# Patient Record
Sex: Female | Born: 1985 | Hispanic: Yes | Marital: Married | Smoking: Never smoker
Health system: Southern US, Community
[De-identification: ages and names within clinical notes are randomized; demographics above are authoritative.]

## PROBLEM LIST (undated history)

## (undated) DIAGNOSIS — J45909 Unspecified asthma, uncomplicated: Secondary | ICD-10-CM

## (undated) DIAGNOSIS — R112 Nausea with vomiting, unspecified: Secondary | ICD-10-CM

## (undated) DIAGNOSIS — K219 Gastro-esophageal reflux disease without esophagitis: Secondary | ICD-10-CM

## (undated) DIAGNOSIS — Z9889 Other specified postprocedural states: Secondary | ICD-10-CM

## (undated) HISTORY — PX: HYSTERECTOMY ABDOMINAL WITH SALPINGECTOMY: SHX6725

---

## 2019-08-17 ENCOUNTER — Emergency Department (HOSPITAL_COMMUNITY): Payer: Self-pay

## 2019-08-17 ENCOUNTER — Emergency Department (HOSPITAL_COMMUNITY)
Admission: EM | Admit: 2019-08-17 | Discharge: 2019-08-17 | Disposition: A | Discharge status: Home / Self Care | Payer: Self-pay | Attending: Emergency Medicine | Admitting: Emergency Medicine

## 2019-08-17 ENCOUNTER — Encounter (HOSPITAL_COMMUNITY): Payer: Self-pay | Admitting: *Deleted

## 2019-08-17 ENCOUNTER — Other Ambulatory Visit: Payer: Self-pay

## 2019-08-17 DIAGNOSIS — F17210 Nicotine dependence, cigarettes, uncomplicated: Secondary | ICD-10-CM | POA: Insufficient documentation

## 2019-08-17 DIAGNOSIS — R52 Pain, unspecified: Secondary | ICD-10-CM

## 2019-08-17 DIAGNOSIS — K8020 Calculus of gallbladder without cholecystitis without obstruction: Secondary | ICD-10-CM | POA: Insufficient documentation

## 2019-08-17 DIAGNOSIS — K829 Disease of gallbladder, unspecified: Secondary | ICD-10-CM

## 2019-08-17 HISTORY — DX: Unspecified asthma, uncomplicated: J45.909

## 2019-08-17 LAB — URINALYSIS, ROUTINE W REFLEX MICROSCOPIC
Bilirubin Urine: NEGATIVE
Glucose, UA: NEGATIVE mg/dL
Ketones, ur: NEGATIVE mg/dL
Leukocytes,Ua: NEGATIVE
Nitrite: NEGATIVE
Protein, ur: NEGATIVE mg/dL
Specific Gravity, Urine: 1.021 (ref 1.005–1.030)
pH: 5 (ref 5.0–8.0)

## 2019-08-17 LAB — COMPREHENSIVE METABOLIC PANEL
ALT: 114 U/L — ABNORMAL HIGH (ref 0–44)
AST: 171 U/L — ABNORMAL HIGH (ref 15–41)
Albumin: 3.7 g/dL (ref 3.5–5.0)
Alkaline Phosphatase: 126 U/L (ref 38–126)
Anion gap: 7 (ref 5–15)
BUN: 18 mg/dL (ref 6–20)
CO2: 26 mmol/L (ref 22–32)
Calcium: 9.2 mg/dL (ref 8.9–10.3)
Chloride: 104 mmol/L (ref 98–111)
Creatinine, Ser: 0.91 mg/dL (ref 0.44–1.00)
GFR calc Af Amer: >60 mL/min (ref >60)
GFR calc non Af Amer: >60 mL/min (ref >60)
Glucose, Bld: 142 mg/dL — ABNORMAL HIGH (ref 70–99)
Potassium: 4.4 mmol/L (ref 3.5–5.1)
Sodium: 137 mmol/L (ref 135–145)
Total Bilirubin: 0.5 mg/dL (ref 0.3–1.2)
Total Protein: 7.7 g/dL (ref 6.5–8.1)

## 2019-08-17 LAB — CBC
HCT: 39.7 % (ref 36.0–46.0)
Hemoglobin: 13.1 g/dL (ref 12.0–15.0)
MCH: 30 pg (ref 26.0–34.0)
MCHC: 33 g/dL (ref 30.0–36.0)
MCV: 91.1 fL (ref 80.0–100.0)
Platelets: 327 10*3/uL (ref 150–400)
RBC: 4.36 MIL/uL (ref 3.87–5.11)
RDW: 12.1 % (ref 11.5–15.5)
WBC: 12.1 10*3/uL — ABNORMAL HIGH (ref 4.0–10.5)
nRBC: 0 % (ref 0.0–0.2)

## 2019-08-17 LAB — D-DIMER, QUANTITATIVE: D-Dimer, Quant: 0.8 ug/mL-FEU — ABNORMAL HIGH (ref 0.00–0.50)

## 2019-08-17 LAB — TROPONIN I (HIGH SENSITIVITY): Troponin I (High Sensitivity): 6 ng/L (ref <18)

## 2019-08-17 LAB — LIPASE, BLOOD: Lipase: 39 U/L (ref 11–51)

## 2019-08-17 MED ORDER — HYDROMORPHONE HCL 1 MG/ML IJ SOLN
1.0000 mg | Freq: Once | INTRAMUSCULAR | Status: AC
  Administered 2019-08-17: 1 mg via INTRAVENOUS
  Filled 2019-08-17: qty 1

## 2019-08-17 MED ORDER — ONDANSETRON HCL 4 MG/2ML IJ SOLN
4.0000 mg | Freq: Once | INTRAMUSCULAR | Status: AC
  Administered 2019-08-17: 4 mg via INTRAVENOUS
  Filled 2019-08-17: qty 2

## 2019-08-17 MED ORDER — HYDROCODONE-ACETAMINOPHEN 5-325 MG PO TABS: 1.0000 | ORAL_TABLET | ORAL | 0 refills | Status: DC | PRN

## 2019-08-17 MED ORDER — IOHEXOL 350 MG/ML SOLN
75.0000 mL | Freq: Once | INTRAVENOUS | Status: AC | PRN
  Administered 2019-08-17: 75 mL via INTRAVENOUS

## 2019-08-17 MED ORDER — ONDANSETRON 4 MG PO TBDP: 4.0000 mg | ORAL_TABLET | Freq: Three times a day (TID) | ORAL | 0 refills | Status: DC | PRN

## 2019-08-17 NOTE — ED Provider Notes (Signed)
[facility] EMERGENCY DEPARTMENT Provider Note   CSN: [id number] Arrival date & time: 08/17/19  1541     History   Chief Complaint Chief Complaint  Patient presents with  . Abdominal Pain    HPI [name] Moore is a 33 y.o. female.     The history is provided by the patient. No language interpreter was used.  Abdominal Pain Pain location:  Epigastric Pain quality: aching, sharp and squeezing   Pain radiates to:  R flank and L flank Pain severity:  Moderate Onset quality:  Gradual Duration:  2 days Timing:  Constant Chronicity:  New Context: not alcohol use   Relieved by:  Nothing Worsened by:  Nothing Ineffective treatments:  None tried Risk factors: no alcohol abuse and has not had multiple surgeries   Pt began having pain in the epigastric area after eating a hamburger 3 days ago.  Pt reports nausea and vomiting.    Past Medical History:  Diagnosis Date  . Asthma     There are no active problems to display for this patient.   History reviewed. No pertinent surgical history.   OB History   No obstetric history on file.      Home Medications    Prior to Admission medications   Not on File    Family History No family history on file.  Social History Social History   Tobacco Use  . Smoking status: Current Every Day Smoker  . Smokeless tobacco: Never Used  Substance Use Topics  . Alcohol use: Never    Frequency: Never  . Drug use: Never     Allergies   Patient has no known allergies.   Review of Systems Review of Systems  Gastrointestinal: Positive for abdominal pain.  All other systems reviewed and are negative.    Physical Exam Updated Vital Signs BP (!) 95/54   Pulse 65   Temp 98.7 F (37.1 C) (Oral)   Resp 18   Ht 5\' 2"  (1.575 m)   Wt 93 kg   SpO2 97%   BMI 37.50 kg/m   Physical Exam Vitals signs and nursing note reviewed.  Constitutional:      General: She is not in acute distress.  Appearance: She is well-developed.  HENT:     Head: Normocephalic and atraumatic.  Eyes:     Conjunctiva/sclera: Conjunctivae normal.  Neck:     Musculoskeletal: Normal range of motion and neck supple.  Cardiovascular:     Rate and Rhythm: Normal rate and regular rhythm.     Heart sounds: No murmur.  Pulmonary:     Effort: Pulmonary effort is normal. No respiratory distress.     Breath sounds: Normal breath sounds.  Abdominal:     General: Bowel sounds are normal. There is no distension.     Palpations: Abdomen is soft.     Tenderness: There is abdominal tenderness.     Comments: Tender epigastric,  Pain with palpation,    Musculoskeletal: Normal range of motion.  Skin:    General: Skin is warm and dry.  Neurological:     Mental Status: She is alert and oriented to person, place, and time.  Psychiatric:        Mood and Affect: Mood normal.      ED Treatments / Results  Labs (all labs ordered are listed, but only abnormal results are displayed) Labs Reviewed  COMPREHENSIVE METABOLIC PANEL - Abnormal; Notable for the following components:      Result  Value   Glucose, Bld 142 (*)    AST 171 (*)    ALT 114 (*)    All other components within normal limits  CBC - Abnormal; Notable for the following components:   WBC 12.1 (*)    All other components within normal limits  URINALYSIS, ROUTINE W REFLEX MICROSCOPIC - Abnormal; Notable for the following components:   Hgb urine dipstick SMALL (*)    Bacteria, UA RARE (*)    All other components within normal limits  D-DIMER, QUANTITATIVE (NOT [facility]) - Abnormal; Notable for the following components:   D-Dimer, Quant 0.80 (*)    All other components within normal limits  LIPASE, BLOOD  TROPONIN I (HIGH SENSITIVITY)  TROPONIN I (HIGH SENSITIVITY)    EKG None  Radiology Dg Chest 2 View  Result Date: 08/17/2019 CLINICAL DATA:  Chest pain and shortness of breath. EXAM: CHEST - 2 VIEW COMPARISON:  None. FINDINGS: The lungs  are clear without focal pneumonia, edema, pneumothorax or pleural effusion. The cardiopericardial silhouette is within normal limits for size. The visualized bony structures of the thorax are intact. IMPRESSION: No active cardiopulmonary disease. Electronically Signed   By: Kennith CenterEric  Mansell M.D.   On: 08/17/2019 19:14   Ct Angio Chest Pe W And/or Wo Contrast  Result Date: 08/17/2019 CLINICAL DATA:  Epigastric pain with nausea and vomiting EXAM: CT ANGIOGRAPHY CHEST WITH CONTRAST TECHNIQUE: Multidetector CT imaging of the chest was performed using the standard protocol during bolus administration of intravenous contrast. Multiplanar CT image reconstructions and MIPs were obtained to evaluate the vascular anatomy. CONTRAST:  66 mL OMNIPAQUE 350 COMPARISON:  Chest x-ray and ultrasound from earlier in the same day. FINDINGS: Cardiovascular: Thoracic aorta and its branches are within normal limits. No aneurysmal dilatation or dissection is seen. No cardiac enlargement is seen. The pulmonary artery is well visualized within normal branching pattern. No definitive filling defect to suggest pulmonary embolism is noted. Mediastinum/Nodes: Thoracic inlet is within normal limits. No hilar or mediastinal adenopathy is seen. Esophagus as visualized is within normal limits. Lungs/Pleura: Lungs are well aerated bilaterally without focal infiltrate or sizable effusion. Upper Abdomen: Visualized upper abdomen demonstrates cholelithiasis similar to that seen on recent ultrasound. Musculoskeletal: No chest wall abnormality. No acute or significant osseous findings. Review of the MIP images confirms the above findings. IMPRESSION: No evidence of pulmonary emboli. Cholelithiasis without complicating factors. Electronically Signed   By: Alcide CleverMark  Lukens M.D.   On: 08/17/2019 20:46   [place] Abdomen Complete  Result Date: 08/17/2019 CLINICAL DATA:  Epigastric pain for several days EXAM: ABDOMEN ULTRASOUND COMPLETE COMPARISON:  None. FINDINGS:  Gallbladder: Well distended with multiple gallstones without complicating factors. No wall thickening or pericholecystic fluid is noted. Negative sonographic Murphy's sign is seen. Common bile duct: Diameter: 6.7 mm. No intrahepatic ductal dilatation is seen. Liver: Mild increased echogenicity is noted which may be related to fatty infiltration. Portal vein is patent on color Doppler imaging with normal direction of blood flow towards the liver. IVC: Not well visualized due to overlying bowel gas. Pancreas: Not well visualized due to overlying bowel gas. Spleen: Size and appearance within normal limits. Right Kidney: Length: 10.1 cm. Echogenicity within normal limits. No mass or hydronephrosis visualized. Left Kidney: Length: 11.4 cm. Echogenicity within normal limits. No mass or hydronephrosis visualized. Abdominal aorta: No aneurysm visualized. Other findings: None. IMPRESSION: Cholelithiasis without complicating factors. Mild prominence of the common bile duct is noted. Electronically Signed   By: Eulah PontMark  Lukens M.D.  On: 08/17/2019 19:04    Procedures Procedures (including critical care time)  Medications Ordered in ED Medications  HYDROmorphone (DILAUDID) injection 1 mg (1 mg Intravenous Given 08/17/19 1927)  ondansetron (ZOFRAN) injection 4 mg (4 mg Intravenous Given 08/17/19 1926)  iohexol (OMNIPAQUE) 350 MG/ML injection 75 mL (75 mLs Intravenous Contrast Given 08/17/19 2031)     Initial Impression / Assessment and Plan / ED Course  I have reviewed the triage vital signs and the nursing notes.  Pertinent labs & imaging results that were available during my care of the patient were reviewed by me and considered in my medical decision making (see chart for details).  Clinical Course as of Aug 16 2240  Sun Aug 17, 2019  2236 D-Dimer, Quant(!): 0.80 [AW]  2236 D-Dimer, Quant(!): 0.80 [AW]  2237 D-dimer, quantitative (not at [facility])(!) [AW]    Clinical Course User Index [AW] Burt Ek       MDM   Pt reports pain improved with pain medication.  Pt reports medicine made her to sleepy and se felt like it was to strong.  Pt reports some continued discomfort.  Pt has elevated wbc count of 12.1.  ast of 171 and alt of 114.  Pt has elevated ddimer of .80  Ct angio no pe.  Ultrasound shows choleolithiasis with mildly prominent common bile duct  I discussed the pt with Dr. Wilson Singer.   I consulted Dr. Grandville Silos who advised follow up in office.   Final Clinical Impressions(s) / ED Diagnoses   Final diagnoses:  Calculus of gallbladder without cholecystitis without obstruction    ED Discharge Orders         Ordered    ondansetron (ZOFRAN ODT) 4 MG disintegrating tablet  Every 8 hours PRN     08/17/19 2239    HYDROcodone-acetaminophen (NORCO/VICODIN) 5-325 MG tablet  Every 4 hours PRN     08/17/19 2239        An After Visit Summary was printed and given to the patient.    Sidney Ace 08/17/19 2241    Virgel Manifold, MD 08/19/19 [phone]

## 2019-08-17 NOTE — ED Notes (Signed)
To ultrasound

## 2019-08-17 NOTE — ED Triage Notes (Signed)
The pt arrived here from [place] 2 months ago  She is c/o epigastric pain with nausea and vomiting for 2-3 days this time  lmp  lmp none hysterectomy

## 2019-08-17 NOTE — ED Notes (Signed)
The pt was told in [place] that she has gallbladder problems

## 2019-08-21 ENCOUNTER — Telehealth: Payer: Self-pay

## 2019-08-21 NOTE — Telephone Encounter (Signed)
Message received from [facility] [place] requesting an appointment for patient at [facility]. Informed her that appt has been scheduled for 09/05/2019 @ 1430 with Dr Chapman Fitch

## 2019-09-03 ENCOUNTER — Telehealth: Payer: Self-pay | Admitting: Pediatric Intensive Care

## 2019-09-03 NOTE — Telephone Encounter (Signed)
Call from client. She has received the bill for the ED visit earlier in the month. CN advised client that she has an appointment at [facility] on Friday and gave reminder. Client states that she has transportation to appointment. CN advised client that she will communicate with clinic that client has received bill and will need to apply for financial assistance. CN also advised client to call tomorrow if transportation falls through. Client states that she will call. Lisette Abu Rn BSN CNP

## 2019-09-05 ENCOUNTER — Encounter: Payer: Self-pay | Admitting: Family Medicine

## 2019-09-05 ENCOUNTER — Other Ambulatory Visit: Payer: Self-pay

## 2019-09-05 ENCOUNTER — Ambulatory Visit: Payer: Self-pay | Attending: Family Medicine | Admitting: Family Medicine

## 2019-09-05 VITALS — BP 110/73 | HR 85 | Temp 98.0°F | Ht 60.0 in | Wt 215.0 lb

## 2019-09-05 DIAGNOSIS — Z758 Other problems related to medical facilities and other health care: Secondary | ICD-10-CM

## 2019-09-05 DIAGNOSIS — Z789 Other specified health status: Secondary | ICD-10-CM

## 2019-09-05 DIAGNOSIS — K8020 Calculus of gallbladder without cholecystitis without obstruction: Secondary | ICD-10-CM

## 2019-09-05 DIAGNOSIS — Z603 Acculturation difficulty: Secondary | ICD-10-CM

## 2019-09-05 DIAGNOSIS — Z09 Encounter for follow-up examination after completed treatment for conditions other than malignant neoplasm: Secondary | ICD-10-CM

## 2019-09-05 NOTE — Patient Instructions (Signed)
Colelitiasis Cholelithiasis  La colelitiasis tambin recibe el nombre de "clculos biliares". Es un tipo de enfermedad de la vescula biliar. La vescula biliar es un rgano que almacena un lquido (bilis) que ayuda a Publishing copy las [place]. Los clculos biliares podran no causar sntomas (clculos biliares silenciosos) hasta provocar una obstruccin; luego, pueden causar dolor (ataque de la vescula biliar). Siga estas indicaciones en su casa:  Tome los medicamentos de venta libre y los recetados solamente como se lo haya indicado el mdico.  Mantenga un peso saludable.  Consuma alimentos saludables. Esto incluye lo siguiente: ? Comer una menor cantidad de alimentos grasos, como los alimentos fritos. ? Comer una menor cantidad de carbohidratos refinados. Los carbohidratos refinados son los panes y los cereales muy procesados, como el pan blanco y el arroz blanco. En cambio, elegir cereales integrales, como el pan integral o el arroz integral. ? Consumir ms fibra. [place], las frutas frescas y los frijoles son fuentes saludables de [place].  Concurra a todas las visitas de seguimiento como se lo haya indicado el mdico. Esto es importante. Comunquese con un mdico si:  De repente, siente dolor en el costado superior derecho del vientre (abdomen). El dolor podra extenderse hasta el hombro derecho o el pecho. Estos pueden ser sntomas de un ataque de la vescula biliar.  Siente Higher education careers adviser (tiene nuseas).  Devuelve (vomita).  Le han diagnosticado clculos biliares que no presentan sntomas y tiene lo siguiente: ? Dolor abdominal. ? Molestias, ardor o sensacin de plenitud en la parte superior del vientre (empacho). Solicite ayuda de inmediato si:  De repente, siente dolor en el costado superior derecho del vientre que dura ms de 2horas.  Tiene dolor abdominal que dura ms de 5horas.  Tiene fiebre o siente escalofros.  Sigue sintiendo nuseas o vomitando.  Nota que  la piel o la parte blanca del ojo estn amarillas (ictericia).  Hace pis (orina) de color oscuro.  Su materia fecal (heces) es de tono muy claro. Resumen  La colelitiasis tambin recibe el nombre de "clculos biliares".  La vescula biliar es un rgano que almacena un lquido (bilis) que ayuda a Publishing copy las [place].  Los clculos biliares silenciosos son clculos biliares que no causan sntomas.  Un ataque de la vescula biliar podra causar un dolor repentino en el costado superior derecho del vientre. El dolor podra extenderse hasta el hombro derecho o el pecho. Si esto ocurre, comunquese con el mdico.  Si le aparece un dolor repentino en el costado superior derecho del vientre que dura ms de 2horas, busque ayuda de inmediato. Esta informacin no tiene Marine scientist el consejo del mdico. Asegrese de hacerle al mdico cualquier pregunta que tenga. Document Released: 02/16/2009 Document Revised: 05/21/2017 Document Reviewed: 05/14/2013 Elsevier Patient Education  2020 [employer].

## 2019-09-05 NOTE — Progress Notes (Addendum)
Subjective:  Patient ID: [name] Moore, female    DOB: Nov 01, 1986  Age: 33 y.o. MRN: [id number]  CC: Hospitalization Follow-up  Due to a language barrier, Stratus video interpretation system was used at today's visit  HPI [name] Moore, 33 year old female, who presents to establish care after ED visit on 08/17/2019 at which time she presented secondary to abdominal pain.  Patient had ultrasound showing cholelithiasis with mildly prominent common bile duct and patient had white blood cell count of 12.1, AST of 171 and ALT of 114.  Patient was discharged with prescriptions for Zofran and hydrocodone-acetaminophen and needs outpatient follow-up.  She was to follow-up with Dr. Violeta Gelinas in general surgery but when she called the office, she states that she could not afford follow-up due to lack of insurance.  She states that someone from the office, [place], told her that she would help patient with resources in order to be able to find someone who could do patient surgery.  Patient has paperwork with her at today's visit so that she can apply for the orange card to help with the cost of her surgery.      She reports no current abdominal pain or nausea at today's visit.  She did have some mild right upper to mid abdominal discomfort a few days ago.  She is aware that she should avoid spicy and greasy foods.  She denies any current issues with diarrhea/loose stools.  No fever or chills.  She has had no headaches or dizziness, no chest pain or palpitations and no shortness of breath or cough.  She reports that her only past medical history is that she had issues with asthma in childhood but these have now resolved and she has had no problems with her asthma as an adult.  She has past surgical history of hysterectomy due to an ectopic surgery.  She states that she was having surgery she thought to have removal of a fallopian tube but after the surgery she was told that the uterus had to be  removed.  Past Medical History:  Diagnosis Date  . Asthma   She reports asthma in childhood but no issues as an adult  Past Surgical History:  Procedure Laterality Date  . HYSTERECTOMY ABDOMINAL WITH SALPINGECTOMY     due to ectopic surgery per patient    Family History  Problem Relation Age of Onset  . Hypertension Father   . Hypertension Maternal Grandmother   . Hypertension Maternal Grandfather     Social History   Tobacco Use  . Smoking status: Former Games developer  . Smokeless tobacco: Never Used  Substance Use Topics  . Alcohol use: Never    Frequency: Never    ROS Review of Systems  Constitutional: Positive for fatigue. Negative for chills and fever.  HENT: Negative for sore throat and trouble swallowing.   Respiratory: Negative for cough and shortness of breath.   Cardiovascular: Negative for chest pain and palpitations.  Gastrointestinal: Positive for abdominal pain (Earlier this week but not currently) and nausea (Earlier this week). Negative for constipation, diarrhea and vomiting.  Endocrine: Negative for cold intolerance, heat intolerance, polydipsia, polyphagia and polyuria.  Genitourinary: Negative for dysuria and frequency.  Neurological: Negative for dizziness and headaches.    Objective:   Today's Vitals: BP 110/73   Pulse 85   Temp 98 F (36.7 C) (Oral)   Ht 5' (1.524 m)   Wt 215 lb (97.5 kg)   SpO2 97%   BMI 41.99 kg/m  Physical Exam Constitutional:      General: She is not in acute distress.    Appearance: Normal appearance. She is obese. She is not ill-appearing.  Neck:     Musculoskeletal: Normal range of motion and neck supple. No muscular tenderness.  Cardiovascular:     Rate and Rhythm: Normal rate and regular rhythm.  Pulmonary:     Effort: Pulmonary effort is normal.     Breath sounds: Normal breath sounds.  Abdominal:     Palpations: Abdomen is soft.     Tenderness: There is abdominal tenderness (Mild right upper quadrant  tenderness on exam). There is no right CVA tenderness, left CVA tenderness, guarding or rebound.  Musculoskeletal:        General: No tenderness.     Right lower leg: No edema.     Left lower leg: No edema.     Comments: No CVA tenderness, no reproducible back pain  Lymphadenopathy:     Cervical: No cervical adenopathy.  Skin:    General: Skin is warm and dry.     Coloration: Skin is not jaundiced.  Neurological:     General: No focal deficit present.     Mental Status: She is alert and oriented to person, place, and time.  Psychiatric:        Mood and Affect: Mood normal.        Behavior: Behavior normal.     Assessment & Plan:  1. Gallstones; 2.  Encounter for examination following treatment at hospital Patient status post emergency department visit on 08/17/2019 due to abdominal pain and was diagnosed with gallstones.  She had ultrasound showing cholelithiasis without complicating factors.  Mild prominence of the common bile duct at 6.7 mm.  Referral will be placed for patient to be seen by general surgery for removal of the gallbladder.  Handout provided in Spanish on gallstones and she is aware that she should avoid eating spicy/greasy foods.  Patient will need appointment with financial counselors in order to see if she qualifies for financial assistance program through this office and patient is aware. - Ambulatory referral to General Surgery  Outpatient Encounter Medications as of 09/05/2019  Medication Sig  . HYDROcodone-acetaminophen (NORCO/VICODIN) 5-325 MG tablet Take 1 tablet by mouth every 4 (four) hours as needed.  . ondansetron (ZOFRAN ODT) 4 MG disintegrating tablet Take 1 tablet (4 mg total) by mouth every 8 (eight) hours as needed for nausea or vomiting.   No facility-administered encounter medications on file as of 09/05/2019.    *Patient was offered and agreed to have influenza immunization.  This will be given as a nurse visit while she is here today at the office.    Follow-up: Return for As needed; follow-up after surgery and schedule annual well exam.   Antony Blackbird MD

## 2019-09-08 ENCOUNTER — Other Ambulatory Visit: Payer: Self-pay

## 2019-09-08 ENCOUNTER — Ambulatory Visit: Payer: Self-pay | Attending: Family Medicine

## 2019-10-16 ENCOUNTER — Ambulatory Visit (INDEPENDENT_AMBULATORY_CARE_PROVIDER_SITE_OTHER): Payer: Self-pay | Admitting: General Surgery

## 2019-10-16 ENCOUNTER — Encounter: Payer: Self-pay | Admitting: General Surgery

## 2019-10-16 ENCOUNTER — Other Ambulatory Visit: Payer: Self-pay

## 2019-10-16 VITALS — BP 136/83 | HR 78 | Temp 98.1°F | Resp 14 | Ht 62.0 in | Wt 215.0 lb

## 2019-10-16 DIAGNOSIS — K8020 Calculus of gallbladder without cholecystitis without obstruction: Secondary | ICD-10-CM

## 2019-10-16 NOTE — H&P (View-Only) (Signed)
Patient ID: [name] Moore, female   DOB: 03-08-1986, 33 y.o.   MRN: [id number]  Chief Complaint  Patient presents with  . New Patient (Initial Visit)    gallbladder    HPI [name] Moore is a 33 y.o. female.   She has been referred by her primary care provider, Dr. Antony Blackbird, for surgical evaluation of cholelithiasis.  Today's visit was held with the assistance of a Spanish language interpreter.    [name] presented to the emergency department at [facility] on September 13.  She had abdominal pain, nausea, vomiting and was found to have cholelithiasis on right upper quadrant ultrasound.  She was referred to see a surgeon in [place], but secondary to her lack of insurance, she was unable to keep that appointment.  She has been working with a foundation to obtain coverage for surgery.  She states that since her visit to the emergency department, she has had intermittent episodes of right upper quadrant pain.  These have been accompanied by nausea and occasionally vomiting.  The pain is predominantly in her right upper quadrant but does radiate around her side and also from her epigastric area through to her back.  She has never had jaundice or pancreatitis.  She does report recent dark-colored urine with a strong odor as well as some minor burning with urination.  She does not know if this could be a urinary tract infection.  She has not had any diarrhea or constipation.  No fevers or chills.  Her symptoms do seem to be exacerbated by eating spicy or fatty foods.   Past Medical History:  Diagnosis Date  . Asthma     Past Surgical History:  Procedure Laterality Date  . HYSTERECTOMY ABDOMINAL WITH SALPINGECTOMY     due to ectopic pregnancy per patient    Family History  Problem Relation Age of Onset  . Hypertension Father   . Hypertension Maternal Grandmother   . Hypertension Maternal Grandfather     Social History Social History   Tobacco Use  .  Smoking status: Never Smoker  . Smokeless tobacco: Never Used  Substance Use Topics  . Alcohol use: Never    Frequency: Never  . Drug use: Never    No Known Allergies  Current Outpatient Medications  Medication Sig Dispense Refill  . HYDROcodone-acetaminophen (NORCO/VICODIN) 5-325 MG tablet Take 1 tablet by mouth every 4 (four) hours as needed. 10 tablet 0   No current facility-administered medications for this visit.     Review of Systems Review of Systems  All other systems reviewed and are negative. Or as discussed in the history of present illness  Blood pressure 136/83, pulse 78, temperature 98.1 F (36.7 C), temperature source Temporal, resp. rate 14, height 5\' 2"  (1.575 m), weight 215 lb (97.5 kg), SpO2 98 %. Body mass index is 39.32 kg/m.  Physical Exam Physical Exam Constitutional:      General: She is not in acute distress.    Appearance: She is obese.  HENT:     Head: Normocephalic and atraumatic.     Nose:     Comments: Covered with a mask secondary to COVID-19 precautions    Mouth/Throat:     Comments: Covered with a mask secondary to COVID-19 precautions Eyes:     General: No scleral icterus.       Right eye: No discharge.        Left eye: No discharge.  Neck:     Musculoskeletal: No neck rigidity.  Comments: No thyromegaly or dominant thyroid masses appreciated Cardiovascular:     Rate and Rhythm: Normal rate and regular rhythm.     Pulses: Normal pulses.  Pulmonary:     Effort: Pulmonary effort is normal.     Breath sounds: Normal breath sounds.  Abdominal:     General: Bowel sounds are normal.     Palpations: Abdomen is soft.     Tenderness: There is abdominal tenderness. There is no guarding or rebound.     Comments: There is mild tenderness to deep palpation in the right upper quadrant.  Murphy sign is negative.  Genitourinary:    Comments: Deferred Musculoskeletal:        General: No swelling.     Right lower leg: No edema.     Left  lower leg: No edema.  Lymphadenopathy:     Cervical: No cervical adenopathy.  Skin:    General: Skin is warm and dry.  Neurological:     General: No focal deficit present.     Mental Status: She is alert and oriented to person, place, and time.  Psychiatric:        Behavior: Behavior normal.     Data Reviewed Labs obtained at the time of her emergency department visit show a normal bilirubin with mild elevation in transaminases.  Lipase was within normal limits.  She did have a mild leukocytosis at 12.1.  Dimer was elevated but a PE protocol CT scan was negative.  Results for [name] Moore (MRN [id number]) as of 10/16/2019 11:48  Ref. Range 08/17/2019 17:05  Sodium Latest Ref Range: 135 - 145 mmol/L 137  Potassium Latest Ref Range: 3.5 - 5.1 mmol/L 4.4  Chloride Latest Ref Range: 98 - 111 mmol/L 104  CO2 Latest Ref Range: 22 - 32 mmol/L 26  Glucose Latest Ref Range: 70 - 99 mg/dL 142 (H)  BUN Latest Ref Range: 6 - 20 mg/dL 18  Creatinine Latest Ref Range: 0.44 - 1.00 mg/dL 0.91  Calcium Latest Ref Range: 8.9 - 10.3 mg/dL 9.2  Anion gap Latest Ref Range: 5 - 15  7  Alkaline Phosphatase Latest Ref Range: 38 - 126 U/L 126  Albumin Latest Ref Range: 3.5 - 5.0 g/dL 3.7  Lipase Latest Ref Range: 11 - 51 U/L 39  AST Latest Ref Range: 15 - 41 U/L 171 (H)  ALT Latest Ref Range: 0 - 44 U/L 114 (H)  Total Protein Latest Ref Range: 6.5 - 8.1 g/dL 7.7  Total Bilirubin Latest Ref Range: 0.3 - 1.2 mg/dL 0.5  GFR, Est Non African American Latest Ref Range: >60 mL/min >60  GFR, Est African American Latest Ref Range: >60 mL/min >60  Troponin I (High Sensitivity) Latest Ref Range: <18 ng/L 6  WBC Latest Ref Range: 4.0 - 10.5 K/uL 12.1 (H)  RBC Latest Ref Range: 3.87 - 5.11 MIL/uL 4.36  Hemoglobin Latest Ref Range: 12.0 - 15.0 g/dL 13.1  HCT Latest Ref Range: 36.0 - 46.0 % 39.7  MCV Latest Ref Range: 80.0 - 100.0 fL 91.1  MCH Latest Ref Range: 26.0 - 34.0 pg 30.0  MCHC Latest Ref Range:  30.0 - 36.0 g/dL 33.0  RDW Latest Ref Range: 11.5 - 15.5 % 12.1  Platelets Latest Ref Range: 150 - 400 K/uL 327  nRBC Latest Ref Range: 0.0 - 0.2 % 0.0  D-Dimer, Quant Latest Ref Range: 0.00 - 0.50 ug/mL-FEU 0.80 (H)   I reviewed the right upper quadrant ultrasound that was performed on 13 September.    I am in agreement with the radiologist report which is copied here: CLINICAL DATA:  Epigastric pain for several days  EXAM: ABDOMEN ULTRASOUND COMPLETE  COMPARISON:  None.  FINDINGS: Gallbladder: Well distended with multiple gallstones without complicating factors. No wall thickening or pericholecystic fluid is noted. Negative sonographic Murphy's sign is seen.  Common bile duct: Diameter: 6.7 mm. No intrahepatic ductal dilatation is seen.  Liver: Mild increased echogenicity is noted which may be related to fatty infiltration. Portal vein is patent on color Doppler imaging with normal direction of blood flow towards the liver.  IVC: Not well visualized due to overlying bowel gas.  Pancreas: Not well visualized due to overlying bowel gas.  Spleen: Size and appearance within normal limits.  Right Kidney: Length: 10.1 cm. Echogenicity within normal limits. No mass or hydronephrosis visualized.  Left Kidney: Length: 11.4 cm. Echogenicity within normal limits. No mass or hydronephrosis visualized.  Abdominal aorta: No aneurysm visualized.  Other findings: None.  IMPRESSION: Cholelithiasis without complicating factors. Mild prominence of the common bile duct is noted.  Assessment This is a 33 year old woman with symptomatic cholelithiasis.  She has had multiple attacks with increasing frequency.  I have recommended that she undergo laparoscopic cholecystectomy.   Plan I discussed the procedure in detail.  We discussed the risks and benefits of a laparoscopic cholecystectomy and possible cholangiogram including, but not limited to: bleeding, infection, injury to  surrounding structures such as the intestine or liver, bile leak, retained gallstones, need to convert to an open procedure, prolonged diarrhea, blood clots such as DVT, common bile duct injury, anesthesia risks, and possible need for additional procedures. The patient had the opportunity to ask any questions and these were answered to her satisfaction.    We will work on getting her scheduled.  We will also need to confirm that she has obtained the orange card for financial assistance.  Duanne Guess 10/16/2019, 11:43 AM

## 2019-10-16 NOTE — Progress Notes (Signed)
Patient ID: [name] Moore Moore, female   DOB: 03-08-1986, 33 y.o.   MRN: [id number]  Chief Complaint  Patient presents with  . New Patient (Initial Visit)    gallbladder    HPI [name] Moore Moore is a 33 y.o. female.   She has been referred by her primary care provider, Dr. Antony Moore, for surgical evaluation of cholelithiasis.  Today's visit was held with the assistance of a Spanish language interpreter.    [name] Moore presented to the emergency department at [facility] on September 13.  She had abdominal pain, nausea, vomiting and was found to have cholelithiasis on right upper quadrant ultrasound.  She was referred to see a surgeon in [place], but secondary to her lack of insurance, she was unable to keep that appointment.  She has been working with a foundation to obtain coverage for surgery.  She states that since her visit to the emergency department, she has had intermittent episodes of right upper quadrant pain.  These have been accompanied by nausea and occasionally vomiting.  The pain is predominantly in her right upper quadrant but does radiate around her side and also from her epigastric area through to her back.  She has never had jaundice or pancreatitis.  She does report recent dark-colored urine with a strong odor as well as some minor burning with urination.  She does not know if this could be a urinary tract infection.  She has not had any diarrhea or constipation.  No fevers or chills.  Her symptoms do seem to be exacerbated by eating spicy or fatty foods.   Past Medical History:  Diagnosis Date  . Asthma     Past Surgical History:  Procedure Laterality Date  . HYSTERECTOMY ABDOMINAL WITH SALPINGECTOMY     due to ectopic pregnancy per patient    Family History  Problem Relation Age of Onset  . Hypertension Father   . Hypertension Maternal Grandmother   . Hypertension Maternal Grandfather     Social History Social History   Tobacco Use  .  Smoking status: Never Smoker  . Smokeless tobacco: Never Used  Substance Use Topics  . Alcohol use: Never    Frequency: Never  . Drug use: Never    No Known Allergies  Current Outpatient Medications  Medication Sig Dispense Refill  . HYDROcodone-acetaminophen (NORCO/VICODIN) 5-325 MG tablet Take 1 tablet by mouth every 4 (four) hours as needed. 10 tablet 0   No current facility-administered medications for this visit.     Review of Systems Review of Systems  All other systems reviewed and are negative. Or as discussed in the history of present illness  Blood pressure 136/83, pulse 78, temperature 98.1 F (36.7 C), temperature source Temporal, resp. rate 14, height 5\' 2"  (1.575 m), weight 215 lb (97.5 kg), SpO2 98 %. Body mass index is 39.32 kg/m.  Physical Exam Physical Exam Constitutional:      General: She is not in acute distress.    Appearance: She is obese.  HENT:     Head: Normocephalic and atraumatic.     Nose:     Comments: Covered with a mask secondary to COVID-19 precautions    Mouth/Throat:     Comments: Covered with a mask secondary to COVID-19 precautions Eyes:     General: No scleral icterus.       Right eye: No discharge.        Left eye: No discharge.  Neck:     Musculoskeletal: No neck rigidity.  Comments: No thyromegaly or dominant thyroid masses appreciated Cardiovascular:     Rate and Rhythm: Normal rate and regular rhythm.     Pulses: Normal pulses.  Pulmonary:     Effort: Pulmonary effort is normal.     Breath sounds: Normal breath sounds.  Abdominal:     General: Bowel sounds are normal.     Palpations: Abdomen is soft.     Tenderness: There is abdominal tenderness. There is no guarding or rebound.     Comments: There is mild tenderness to deep palpation in the right upper quadrant.  Murphy sign is negative.  Genitourinary:    Comments: Deferred Musculoskeletal:        General: No swelling.     Right lower leg: No edema.     Left  lower leg: No edema.  Lymphadenopathy:     Cervical: No cervical adenopathy.  Skin:    General: Skin is warm and dry.  Neurological:     General: No focal deficit present.     Mental Status: She is alert and oriented to person, place, and time.  Psychiatric:        Behavior: Behavior normal.     Data Reviewed Labs obtained at the time of her emergency department visit show a normal bilirubin with mild elevation in transaminases.  Lipase was within normal limits.  She did have a mild leukocytosis at 12.1.  Dimer was elevated but a PE protocol CT scan was negative.  Results for [name] Moore Moore, [name] Moore (MRN [id number]) as of 10/16/2019 11:48  Ref. Range 08/17/2019 17:05  Sodium Latest Ref Range: 135 - 145 mmol/L 137  Potassium Latest Ref Range: 3.5 - 5.1 mmol/L 4.4  Chloride Latest Ref Range: 98 - 111 mmol/L 104  CO2 Latest Ref Range: 22 - 32 mmol/L 26  Glucose Latest Ref Range: 70 - 99 mg/dL 782142 (H)  BUN Latest Ref Range: 6 - 20 mg/dL 18  Creatinine Latest Ref Range: 0.44 - 1.00 mg/dL 9.560.91  Calcium Latest Ref Range: 8.9 - 10.3 mg/dL 9.2  Anion gap Latest Ref Range: 5 - 15  7  Alkaline Phosphatase Latest Ref Range: 38 - 126 U/L 126  Albumin Latest Ref Range: 3.5 - 5.0 g/dL 3.7  Lipase Latest Ref Range: 11 - 51 U/L 39  AST Latest Ref Range: 15 - 41 U/L 171 (H)  ALT Latest Ref Range: 0 - 44 U/L 114 (H)  Total Protein Latest Ref Range: 6.5 - 8.1 g/dL 7.7  Total Bilirubin Latest Ref Range: 0.3 - 1.2 mg/dL 0.5  GFR, Est Non African American Latest Ref Range: >60 mL/min >60  GFR, Est African American Latest Ref Range: >60 mL/min >60  Troponin I (High Sensitivity) Latest Ref Range: <18 ng/L 6  WBC Latest Ref Range: 4.0 - 10.5 K/uL 12.1 (H)  RBC Latest Ref Range: 3.87 - 5.11 MIL/uL 4.36  Hemoglobin Latest Ref Range: 12.0 - 15.0 g/dL 21.313.1  HCT Latest Ref Range: 36.0 - 46.0 % 39.7  MCV Latest Ref Range: 80.0 - 100.0 fL 91.1  MCH Latest Ref Range: 26.0 - 34.0 pg 30.0  MCHC Latest Ref Range:  30.0 - 36.0 g/dL 08.633.0  RDW Latest Ref Range: 11.5 - 15.5 % 12.1  Platelets Latest Ref Range: 150 - 400 K/uL 327  nRBC Latest Ref Range: 0.0 - 0.2 % 0.0  D-Dimer, Quant Latest Ref Range: 0.00 - 0.50 ug/mL-FEU 0.80 (H)   I reviewed the right upper quadrant ultrasound that was performed on 13 September.  I am in agreement with the radiologist report which is copied here: CLINICAL DATA:  Epigastric pain for several days  EXAM: ABDOMEN ULTRASOUND COMPLETE  COMPARISON:  None.  FINDINGS: Gallbladder: Well distended with multiple gallstones without complicating factors. No wall thickening or pericholecystic fluid is noted. Negative sonographic Murphy's sign is seen.  Common bile duct: Diameter: 6.7 mm. No intrahepatic ductal dilatation is seen.  Liver: Mild increased echogenicity is noted which may be related to fatty infiltration. Portal vein is patent on color Doppler imaging with normal direction of blood flow towards the liver.  IVC: Not well visualized due to overlying bowel gas.  Pancreas: Not well visualized due to overlying bowel gas.  Spleen: Size and appearance within normal limits.  Right Kidney: Length: 10.1 cm. Echogenicity within normal limits. No mass or hydronephrosis visualized.  Left Kidney: Length: 11.4 cm. Echogenicity within normal limits. No mass or hydronephrosis visualized.  Abdominal aorta: No aneurysm visualized.  Other findings: None.  IMPRESSION: Cholelithiasis without complicating factors. Mild prominence of the common bile duct is noted.  Assessment This is a 33 year old woman with symptomatic cholelithiasis.  She has had multiple attacks with increasing frequency.  I have recommended that she undergo laparoscopic cholecystectomy.   Plan I discussed the procedure in detail.  We discussed the risks and benefits of a laparoscopic cholecystectomy and possible cholangiogram including, but not limited to: bleeding, infection, injury to  surrounding structures such as the intestine or liver, bile leak, retained gallstones, need to convert to an open procedure, prolonged diarrhea, blood clots such as DVT, common bile duct injury, anesthesia risks, and possible need for additional procedures. The patient had the opportunity to ask any questions and these were answered to her satisfaction.    We will work on getting her scheduled.  We will also need to confirm that she has obtained the orange card for financial assistance.  Duanne Guess 10/16/2019, 11:43 AM

## 2019-10-16 NOTE — Patient Instructions (Addendum)
Debe informarnos cuando reciba asistencia financiera.  Necesitaremos este papeleo.    lo llamaremos para programar su ciruga  Colecistectoma laparoscpica Laparoscopic Cholecystectomy Una colecistectoma laparoscpica es un procedimiento que se realiza para extirpar la vescula biliar. La vescula biliar es un rgano que tiene forma de pera y se encuentra debajo del hgado, del lado derecho del cuerpo. La vescula biliar almacena bilis, un lquido que ayuda a [employer]. La colecistectoma se realiza con frecuencia debido a la inflamacin de la vescula biliar (colecistitis). Esta afeccin normalmente se debe a la acumulacin de clculos biliares (colelitiasis) en la vescula biliar. Estos clculos pueden obstruir el flujo de la bilis, lo que produce inflamacin y Engineer, mining. En los [employer], podr ser [place]. Este procedimiento se realiza a travs de incisiones pequeas en el abdomen (ciruga laparoscpica). Se introduce un endoscopio delgado que tiene Secretary/administrator (laparoscopio) a travs de una incisin. A travs de las otras incisiones, se introducen pequeos instrumentos quirrgicos. En algunos casos, un procedimiento de [place] laparoscpica puede convertirse en un tipo de ciruga que se realiza a travs de una incisin ms grande [place]). Informe al mdico acerca de lo siguiente:  Cualquier alergia que tenga.  Todos los [employer], incluidos vitaminas, hierbas, gotas oftlmicas, cremas y [address] de [address].  Cualquier problema que usted o sus familiares hayan tenido con anestsicos.  Cualquier enfermedad de la sangre que tenga.  Cirugas a las que se someti.  Cualquier afeccin mdica que tenga.  Si est embarazada o podra estarlo. Cules son los riesgos? En general, se trata de un procedimiento seguro. Sin embargo, pueden ocurrir complicaciones, por ejemplo:  Infeccin.  Hemorragia.  Reacciones alrgicas a  los medicamentos.  Daos a Systems developer u otros rganos.  Un clculo que queda en el conducto biliar comn (coldoco). El conducto coldoco transporta la bilis desde la vescula biliar hacia el intestino delgado.  Una filtracin de bilis del conducto cstico que se comprime cuando se extirpa la vescula biliar. Qu ocurre antes del procedimiento? Mantenerse hidratado Siga las indicaciones del mdico acerca de mantenerse hidratado, las cuales pueden incluir lo siguiente:  Hasta 2horas antes del procedimiento, puede beber lquidos transparentes, como agua, jugos frutales transparentes, caf negro y t solo. Restricciones en las comidas y [address] Siga las indicaciones del mdico respecto de las comidas y bebidas, las cuales pueden incluir lo siguiente:  Ocho horas antes del procedimiento, deje de ingerir comidas o alimentos pesados, por ejemplo, carne, alimentos fritos o alimentos grasos.  Seis horas antes del procedimiento, deje de ingerir comidas o alimentos livianos, como tostadas o cereales.  Seis horas antes del procedimiento, deje de beber [place] o bebidas que [employer].  Dos horas antes del procedimiento, deje de beber lquidos transparentes. Medicamentos  Consulte al mdico sobre: ? Multimedia programmer o suspender los medicamentos que toma habitualmente. Esto es muy importante si toma medicamentos para la diabetes o anticoagulantes. ? Tomar medicamentos como aspirina e ibuprofeno. Estos medicamentos pueden tener un efecto anticoagulante en la [place]. No tome estos medicamentos antes del procedimiento si su mdico le indica que no lo haga.  Pueden indicarle un antibitico para ayudar a prevenir infecciones. Instrucciones generales  Infrmele al mdico antes de la ciruga si se ha resfriado o si tiene una infeccin.  Haga que alguien lo lleve a su casa desde el hospital o la clnica.  Pregntele al mdico cmo se Cabin crew o se Audiological scientist de la Leisure centre manager. Qu ocurre  durante el procedimiento?  Para disminuir el riesgo de contraer una infeccin: ? El equipo mdico se lavar o se Transport planner. ? Le lavarn la piel con jabn. ? Pueden rasurarle la zona [place].  Pueden colocarle un tubo (catter) intravenoso en una de las venas.  Le administrarn uno o ms de los siguientes medicamentos: ? Un medicamento para ayudarlo a relajarse (sedante). ? Un medicamento que lo har dormir (anestesia general).  Le colocarn un tubo en la boca para que pueda respirar.  Su cirujano le har varios cortes pequeos (incisiones) en el abdomen.  El laparoscopio se introducir a travs de una de las pequeas incisiones. La cmara del laparoscopio enviar imgenes a una pantalla de televisin (monitor) que se encuentra en el quirfano. Esto permitir a su Adult nurse del abdomen.  Le inyectarn un gas similar al aire en el abdomen. Esto expandir el abdomen para que el cirujano tenga ms lugar para Chief of Staff.  El resto del instrumental necesario para el procedimiento se introducir a travs de las otras incisiones. Se extirpar la vescula biliar a travs de una de las incisiones.  Se puede examinar el conducto coldoco. Si se encuentran clculos en el conducto coldoco, tal vez deban extirparse.  Despus de la extirpacin de la vescula biliar, se cerrarn las incisiones con puntos (suturas), grapas o goma para cerrar la piel.  Las incisiones pueden cubrirse con una venda (vendaje). Este procedimiento puede variar segn el mdico y el hospital. Sander Nephew ocurre despus del procedimiento?  Le controlarn la presin arterial, la frecuencia cardaca, la frecuencia respiratoria y Retail buyer de oxgeno en la sangre hasta que desaparezca el efecto de los medicamentos administrados.  Le darn analgsicos para Financial controller, si es necesario.  No conduzca durante 24horas si le administraron un sedante. Esta informacin no tiene Marine scientist  el consejo del mdico. Asegrese de hacerle al mdico cualquier pregunta que tenga. Document Released: 11/20/2005 Document Revised: 03/02/2018 Document Reviewed: 05/08/2016 Elsevier Patient Education  2020 [employer].

## 2019-10-23 ENCOUNTER — Telehealth: Payer: Self-pay

## 2019-10-23 NOTE — Telephone Encounter (Signed)
Patient would like to schedule surgery. She has received her financial assistance. Please advise.

## 2019-10-27 ENCOUNTER — Other Ambulatory Visit: Payer: Self-pay | Admitting: General Surgery

## 2019-10-27 DIAGNOSIS — K8020 Calculus of gallbladder without cholecystitis without obstruction: Secondary | ICD-10-CM

## 2019-10-28 ENCOUNTER — Telehealth: Payer: Self-pay | Admitting: General Surgery

## 2019-10-28 NOTE — Telephone Encounter (Signed)
I have called patient to go over surgery information below. No answer. I have left a message for patient to call the office back.  Surgery Date: 11/05/19 with Dr Wilburn Cornelia cholecystectomy.  Preadmission Testing Date: 11/03/19 @ 11:45-office appointment-patient needs to arrive at the [facility], Covid Testing Date: This will be performed after the patient's preadmission testing appointment - patient advised to go to the [facility]  [employer] Video sent via [employer] [facility] and [employer].  Patient has been made aware to call [phone], between 1-3:00pm the day before surgery, to find out what time to arrive.

## 2019-10-29 NOTE — Telephone Encounter (Signed)
Patient has called back and all surgery information was discussed. Patient understands all instructions.

## 2019-11-03 ENCOUNTER — Encounter
Admission: RE | Admit: 2019-11-03 | Discharge: 2019-11-03 | Disposition: A | Discharge status: Home / Self Care | Payer: Self-pay | Source: Ambulatory Visit | Attending: General Surgery | Admitting: General Surgery

## 2019-11-03 ENCOUNTER — Other Ambulatory Visit: Payer: Self-pay

## 2019-11-03 DIAGNOSIS — Z01812 Encounter for preprocedural laboratory examination: Secondary | ICD-10-CM | POA: Insufficient documentation

## 2019-11-03 DIAGNOSIS — Z20828 Contact with and (suspected) exposure to other viral communicable diseases: Secondary | ICD-10-CM | POA: Insufficient documentation

## 2019-11-03 HISTORY — DX: Other specified postprocedural states: Z98.890

## 2019-11-03 HISTORY — DX: Gastro-esophageal reflux disease without esophagitis: K21.9

## 2019-11-03 HISTORY — DX: Other specified postprocedural states: R11.2

## 2019-11-03 NOTE — Patient Instructions (Addendum)
Your procedure is scheduled on: Wednesday 11/05/19. Su procedimiento est programado para: Miercoles 11/05/19.  Report to [facility]. Presntese a: Medical Mall  To find out your arrival time please call [phone] between 1PM - 3PM on Tuesday 11/04/19. Para saber su hora de llegada por favor llame al [phone] entre la 1PM - 3PM el da: Martes 11/04/19.   Remember: Instructions that are not followed completely may result in serious medical risk, up to and including death,  or upon the discretion of your surgeon and anesthesiologist your surgery may need to be rescheduled.  Recuerde: Las instrucciones que no se siguen completamente Armed forces logistics/support/administrative officer en un riesgo de salud grave, incluyendo hasta  la [place] o a discrecin de su cirujano y Scientific laboratory technician, su ciruga se puede posponer.   __X_ 1.Do not eat food after midnight the night before your procedure. No    gum chewing or hard candies. You may drink clear liquids up to 2 hours     before you are scheduled to arrive for your surgery- DO not drink clear     Liquids within 2 hours of the start of your surgery.     Clear Liquids include:    water, apple juice without pulp, clear carbohydrate drink such as    Clearfast of [place], Black Coffee or Tea (Do not add anything to coffee or tea).      No coma nada despus de la medianoche de la noche anterior a su    procedimiento. No coma chicles ni caramelos duros. Puede tomar    lquidos claros hasta 2 horas antes de su hora programada de llegada al     hospital para su procedimiento. No tome lquidos claros durante el     transcurso de las 2 horas de su llegada programada al hospital para su     procedimiento, ya que esto puede llevar a que su procedimiento se    retrase o tenga que volver a Magazine features editor.  Los lquidos claros incluyen:          - Agua o jugo de [place] sin pulpa          - Bebidas claras con carbohidratos como ClearFast o Gatorade          - Caf negro o t claro (sin  leche, sin cremas, no agregue nada al caf ni al t)  No tome nada que no est en esta lista.  Los pacientes con diabetes tipo 1 y tipo 2 solo deben Printmaker.  Llame a la clnica de PreCare o a la unidad de Same Day Surgery si  tiene alguna pregunta sobre estas instrucciones.     ____ 2. Notify your doctor if there is any change in your medical condition (cold,fever, infections).    Informe a su mdico si hay algn cambio en su condicin mdica  (resfriado, fiebre, infecciones).   Do not wear jewelry, make-up, hairpins, clips or nail polish.  No use joyas, maquillajes, pinzas/ganchos para el cabello ni esmalte de uas.  Do not wear lotions, powders, or perfumes. You may wear deodorant.  No use lociones, polvos o perfumes.  Puede usar desodorante.    Do not shave 48 hours prior to surgery. Men may shave face and neck.  No se afeite 48 horas antes de la [place].  Los hombres pueden [employer] cara  y el cuello.   Do not bring valuables to the hospital.   No lleve objetos de valor al hospital.  [facility] is not responsible  for any belongings or valuables.  [place] no se hace responsable de ningn tipo de pertenencias u objetos de Geographical information systems officer.               Contacts, dentures or bridgework may not be worn into surgery.  Los lentes de [place], las dentaduras postizas o puentes no se pueden usar en la [place].      Patients discharged the day of surgery will not be allowed to drive home. A los pacientes que se les da de alta el mismo da de la ciruga no se les permitir conducir a Holiday representative.        ____ Take these medicines the morning of surgery with A SIP OF WATER:          [employer] medicinas la maana de la ciruga con UN SORBO DE AGUA:  1. NONE    ____ Use CHG Soap as directed          Utilice el jabn de CHG segn lo indicado    ____ Stop Anti-inflammatories on TODAY          Deje de tomar antiinflamatorios el da: 11/03/19.

## 2019-11-04 LAB — SARS CORONAVIRUS 2 (TAT 6-24 HRS): SARS Coronavirus 2: NEGATIVE

## 2019-11-05 ENCOUNTER — Ambulatory Visit: Payer: Self-pay | Admitting: Anesthesiology

## 2019-11-05 ENCOUNTER — Telehealth: Payer: Self-pay

## 2019-11-05 ENCOUNTER — Ambulatory Visit
Admission: RE | Admit: 2019-11-05 | Discharge: 2019-11-05 | Disposition: A | Discharge status: Home / Self Care | Payer: Self-pay | Attending: General Surgery | Admitting: General Surgery

## 2019-11-05 ENCOUNTER — Other Ambulatory Visit: Payer: Self-pay

## 2019-11-05 ENCOUNTER — Encounter
Admission: RE | Disposition: A | Discharge status: Home / Self Care | Payer: Self-pay | Source: Home / Self Care | Attending: General Surgery

## 2019-11-05 ENCOUNTER — Encounter: Payer: Self-pay | Admitting: Anesthesiology

## 2019-11-05 DIAGNOSIS — K219 Gastro-esophageal reflux disease without esophagitis: Secondary | ICD-10-CM | POA: Insufficient documentation

## 2019-11-05 DIAGNOSIS — Z90710 Acquired absence of both cervix and uterus: Secondary | ICD-10-CM | POA: Insufficient documentation

## 2019-11-05 DIAGNOSIS — K8010 Calculus of gallbladder with chronic cholecystitis without obstruction: Secondary | ICD-10-CM | POA: Insufficient documentation

## 2019-11-05 DIAGNOSIS — Z8249 Family history of ischemic heart disease and other diseases of the circulatory system: Secondary | ICD-10-CM | POA: Insufficient documentation

## 2019-11-05 DIAGNOSIS — K8020 Calculus of gallbladder without cholecystitis without obstruction: Secondary | ICD-10-CM

## 2019-11-05 DIAGNOSIS — J45909 Unspecified asthma, uncomplicated: Secondary | ICD-10-CM | POA: Insufficient documentation

## 2019-11-05 HISTORY — PX: CHOLECYSTECTOMY: SHX55

## 2019-11-05 SURGERY — LAPAROSCOPIC CHOLECYSTECTOMY
Anesthesia: General

## 2019-11-05 MED ORDER — LIDOCAINE HCL (CARDIAC) PF 100 MG/5ML IV SOSY
PREFILLED_SYRINGE | INTRAVENOUS | Status: DC | PRN
  Administered 2019-11-05: 100 mg via INTRATRACHEAL

## 2019-11-05 MED ORDER — CEFAZOLIN SODIUM-DEXTROSE 2-4 GM/100ML-% IV SOLN
2.0000 g | INTRAVENOUS | Status: AC
  Administered 2019-11-05: 2 g via INTRAVENOUS

## 2019-11-05 MED ORDER — FENTANYL CITRATE (PF) 250 MCG/5ML IJ SOLN
INTRAMUSCULAR | Status: DC | PRN
  Administered 2019-11-05: 50 ug via INTRAVENOUS
  Administered 2019-11-05: 100 ug via INTRAVENOUS
  Administered 2019-11-05 (×2): 50 ug via INTRAVENOUS

## 2019-11-05 MED ORDER — ONDANSETRON HCL 4 MG/2ML IJ SOLN
INTRAMUSCULAR | Status: AC
  Filled 2019-11-05: qty 2

## 2019-11-05 MED ORDER — ACETAMINOPHEN 500 MG PO TABS
1000.0000 mg | ORAL_TABLET | ORAL | Status: AC
  Administered 2019-11-05: 07:00:00 1000 mg via ORAL

## 2019-11-05 MED ORDER — FENTANYL CITRATE (PF) 100 MCG/2ML IJ SOLN
INTRAMUSCULAR | Status: AC
  Administered 2019-11-05: 11:00:00 25 ug via INTRAVENOUS
  Filled 2019-11-05: qty 2

## 2019-11-05 MED ORDER — FAMOTIDINE 20 MG PO TABS
ORAL_TABLET | ORAL | Status: AC
  Administered 2019-11-05: 20 mg via ORAL
  Filled 2019-11-05: qty 1

## 2019-11-05 MED ORDER — PROPOFOL 10 MG/ML IV BOLUS
INTRAVENOUS | Status: DC | PRN
  Administered 2019-11-05: 180 mg via INTRAVENOUS

## 2019-11-05 MED ORDER — ONDANSETRON HCL 4 MG/2ML IJ SOLN
INTRAMUSCULAR | Status: DC | PRN
  Administered 2019-11-05: 4 mg via INTRAVENOUS

## 2019-11-05 MED ORDER — CEFAZOLIN SODIUM-DEXTROSE 2-4 GM/100ML-% IV SOLN
INTRAVENOUS | Status: AC
  Filled 2019-11-05: qty 100

## 2019-11-05 MED ORDER — MIDAZOLAM HCL 2 MG/2ML IJ SOLN
INTRAMUSCULAR | Status: DC | PRN
  Administered 2019-11-05: 2 mg via INTRAVENOUS

## 2019-11-05 MED ORDER — GABAPENTIN 300 MG PO CAPS
300.0000 mg | ORAL_CAPSULE | ORAL | Status: AC
  Administered 2019-11-05: 07:00:00 300 mg via ORAL

## 2019-11-05 MED ORDER — HEMOSTATIC AGENTS (NO CHARGE) OPTIME
TOPICAL | Status: DC | PRN
  Administered 2019-11-05: 1 via TOPICAL

## 2019-11-05 MED ORDER — LIDOCAINE HCL 4 % MT SOLN
OROMUCOSAL | Status: DC | PRN
  Administered 2019-11-05: 4 mL via TOPICAL

## 2019-11-05 MED ORDER — PROMETHAZINE HCL 25 MG/ML IJ SOLN
6.2500 mg | Freq: Once | INTRAMUSCULAR | Status: AC
  Administered 2019-11-05: 12:00:00 6.25 mg via INTRAVENOUS

## 2019-11-05 MED ORDER — MIDAZOLAM HCL 2 MG/2ML IJ SOLN
INTRAMUSCULAR | Status: AC
  Filled 2019-11-05: qty 2

## 2019-11-05 MED ORDER — ROCURONIUM BROMIDE 50 MG/5ML IV SOLN
INTRAVENOUS | Status: AC
  Filled 2019-11-05: qty 1

## 2019-11-05 MED ORDER — CELECOXIB 200 MG PO CAPS
ORAL_CAPSULE | ORAL | Status: AC
  Administered 2019-11-05: 07:00:00 200 mg via ORAL
  Filled 2019-11-05: qty 1

## 2019-11-05 MED ORDER — SODIUM CHLORIDE FLUSH 0.9 % IV SOLN
INTRAVENOUS | Status: AC
  Filled 2019-11-05: qty 10

## 2019-11-05 MED ORDER — SUGAMMADEX SODIUM 500 MG/5ML IV SOLN
INTRAVENOUS | Status: DC | PRN
  Administered 2019-11-05: 400 mg via INTRAVENOUS

## 2019-11-05 MED ORDER — EPHEDRINE SULFATE 50 MG/ML IJ SOLN
INTRAMUSCULAR | Status: DC | PRN
  Administered 2019-11-05: 10 mg via INTRAVENOUS
  Administered 2019-11-05: 30 mg via INTRAVENOUS

## 2019-11-05 MED ORDER — LACTATED RINGERS IV SOLN
INTRAVENOUS | Status: DC
  Administered 2019-11-05: 08:00:00 via INTRAVENOUS

## 2019-11-05 MED ORDER — LIDOCAINE-EPINEPHRINE 1 %-1:100000 IJ SOLN
INTRAMUSCULAR | Status: AC
  Filled 2019-11-05: qty 1

## 2019-11-05 MED ORDER — LIDOCAINE HCL (PF) 2 % IJ SOLN
INTRAMUSCULAR | Status: AC
  Filled 2019-11-05: qty 10

## 2019-11-05 MED ORDER — CHLORHEXIDINE GLUCONATE CLOTH 2 % EX PADS: 6.0000 | MEDICATED_PAD | Freq: Once | CUTANEOUS | Status: DC

## 2019-11-05 MED ORDER — IBUPROFEN 800 MG PO TABS: 800.0000 mg | ORAL_TABLET | Freq: Three times a day (TID) | ORAL | 0 refills | Status: AC | PRN

## 2019-11-05 MED ORDER — HYDROCODONE-ACETAMINOPHEN 5-325 MG PO TABS: 1.0000 | ORAL_TABLET | Freq: Four times a day (QID) | ORAL | 0 refills | Status: AC | PRN

## 2019-11-05 MED ORDER — PROMETHAZINE HCL 25 MG/ML IJ SOLN
INTRAMUSCULAR | Status: AC
  Administered 2019-11-05: 12:00:00 6.25 mg via INTRAVENOUS
  Filled 2019-11-05: qty 1

## 2019-11-05 MED ORDER — ROCURONIUM BROMIDE 100 MG/10ML IV SOLN
INTRAVENOUS | Status: DC | PRN
  Administered 2019-11-05: 10 mg via INTRAVENOUS
  Administered 2019-11-05: 50 mg via INTRAVENOUS

## 2019-11-05 MED ORDER — LIDOCAINE-EPINEPHRINE 1 %-1:100000 IJ SOLN
INTRAMUSCULAR | Status: DC | PRN
  Administered 2019-11-05: 30 mL via SUBCUTANEOUS

## 2019-11-05 MED ORDER — BUPIVACAINE HCL (PF) 0.25 % IJ SOLN
INTRAMUSCULAR | Status: AC
  Filled 2019-11-05: qty 30

## 2019-11-05 MED ORDER — FIBRIN SEALANT 2 ML SINGLE DOSE KIT
PACK | CUTANEOUS | Status: DC | PRN
  Administered 2019-11-05: 2 mL via TOPICAL

## 2019-11-05 MED ORDER — GABAPENTIN 300 MG PO CAPS
ORAL_CAPSULE | ORAL | Status: AC
  Administered 2019-11-05: 07:00:00 300 mg via ORAL
  Filled 2019-11-05: qty 1

## 2019-11-05 MED ORDER — CELECOXIB 200 MG PO CAPS
200.0000 mg | ORAL_CAPSULE | ORAL | Status: AC
  Administered 2019-11-05: 07:00:00 200 mg via ORAL

## 2019-11-05 MED ORDER — FENTANYL CITRATE (PF) 250 MCG/5ML IJ SOLN
INTRAMUSCULAR | Status: AC
  Filled 2019-11-05: qty 5

## 2019-11-05 MED ORDER — DEXAMETHASONE SODIUM PHOSPHATE 10 MG/ML IJ SOLN
INTRAMUSCULAR | Status: AC
  Filled 2019-11-05: qty 1

## 2019-11-05 MED ORDER — PHENYLEPHRINE HCL (PRESSORS) 10 MG/ML IV SOLN
INTRAVENOUS | Status: AC
  Filled 2019-11-05: qty 1

## 2019-11-05 MED ORDER — PROPOFOL 10 MG/ML IV BOLUS
INTRAVENOUS | Status: AC
  Filled 2019-11-05: qty 20

## 2019-11-05 MED ORDER — PHENYLEPHRINE HCL (PRESSORS) 10 MG/ML IV SOLN
INTRAVENOUS | Status: DC | PRN
  Administered 2019-11-05 (×3): 100 ug via INTRAVENOUS

## 2019-11-05 MED ORDER — ONDANSETRON HCL 4 MG/2ML IJ SOLN
4.0000 mg | Freq: Once | INTRAMUSCULAR | Status: AC | PRN
  Administered 2019-11-05: 11:00:00 4 mg via INTRAVENOUS

## 2019-11-05 MED ORDER — ACETAMINOPHEN 500 MG PO TABS
ORAL_TABLET | ORAL | Status: AC
  Administered 2019-11-05: 07:00:00 1000 mg via ORAL
  Filled 2019-11-05: qty 2

## 2019-11-05 MED ORDER — FENTANYL CITRATE (PF) 100 MCG/2ML IJ SOLN
25.0000 ug | INTRAMUSCULAR | Status: DC | PRN
  Administered 2019-11-05 (×2): 25 ug via INTRAVENOUS

## 2019-11-05 MED ORDER — DEXAMETHASONE SODIUM PHOSPHATE 10 MG/ML IJ SOLN
INTRAMUSCULAR | Status: DC | PRN
  Administered 2019-11-05: 10 mg via INTRAVENOUS

## 2019-11-05 MED ORDER — FAMOTIDINE 20 MG PO TABS
20.0000 mg | ORAL_TABLET | Freq: Once | ORAL | Status: AC
  Administered 2019-11-05: 07:00:00 20 mg via ORAL

## 2019-11-05 SURGICAL SUPPLY — 45 items
APPLIER CLIP 5 13 M/L LIGAMAX5 (MISCELLANEOUS) ×2
BLADE SURG SZ11 CARB STEEL (BLADE) ×2 IMPLANT
CANISTER SUCT 1200ML W/VALVE (MISCELLANEOUS) ×2 IMPLANT
CHLORAPREP W/TINT 26 (MISCELLANEOUS) ×2 IMPLANT
CLIP APPLIE 5 13 M/L LIGAMAX5 (MISCELLANEOUS) ×1 IMPLANT
COVER WAND RF STERILE (DRAPES) ×1 IMPLANT
DECANTER SPIKE VIAL GLASS SM (MISCELLANEOUS) ×4 IMPLANT
DEFOGGER SCOPE WARMER CLEARIFY (MISCELLANEOUS) ×2 IMPLANT
DERMABOND ADVANCED (GAUZE/BANDAGES/DRESSINGS) ×1
DERMABOND ADVANCED .7 DNX12 (GAUZE/BANDAGES/DRESSINGS) ×1 IMPLANT
ELECT CAUTERY BLADE TIP 2.5 (TIP) ×2
ELECT REM PT RETURN 9FT ADLT (ELECTROSURGICAL) ×2
ELECTRODE CAUTERY BLDE TIP 2.5 (TIP) ×1 IMPLANT
ELECTRODE REM PT RTRN 9FT ADLT (ELECTROSURGICAL) ×1 IMPLANT
GLOVE BIO SURGEON STRL SZ 6.5 (GLOVE) ×4 IMPLANT
GLOVE INDICATOR 7.0 STRL GRN (GLOVE) ×4 IMPLANT
GOWN STRL REUS W/ TWL LRG LVL3 (GOWN DISPOSABLE) ×3 IMPLANT
GOWN STRL REUS W/TWL LRG LVL3 (GOWN DISPOSABLE) ×3
GRASPER SUT TROCAR 14GX15 (MISCELLANEOUS) ×1 IMPLANT
HEMOSTAT SURGICEL 2X3 (HEMOSTASIS) ×1 IMPLANT
IRRIGATION STRYKERFLOW (MISCELLANEOUS) ×1 IMPLANT
IRRIGATOR STRYKERFLOW (MISCELLANEOUS) ×2
IV NS 1000ML (IV SOLUTION) ×1
IV NS 1000ML BAXH (IV SOLUTION) ×1 IMPLANT
KIT TURNOVER KIT A (KITS) ×2 IMPLANT
LABEL OR SOLS (LABEL) ×2 IMPLANT
NEEDLE HYPO 22GX1.5 SAFETY (NEEDLE) ×2 IMPLANT
NS IRRIG 500ML POUR BTL (IV SOLUTION) ×2 IMPLANT
PACK LAP CHOLECYSTECTOMY (MISCELLANEOUS) ×2 IMPLANT
PENCIL ELECTRO HAND CTR (MISCELLANEOUS) ×2 IMPLANT
POUCH SPECIMEN RETRIEVAL 10MM (ENDOMECHANICALS) ×2 IMPLANT
SCISSORS METZENBAUM CVD 33 (INSTRUMENTS) ×2 IMPLANT
SET TUBE SMOKE EVAC HIGH FLOW (TUBING) ×2 IMPLANT
SLEEVE ADV FIXATION 5X100MM (TROCAR) ×6 IMPLANT
SOLUTION ELECTROLUBE (MISCELLANEOUS) ×2 IMPLANT
STRIP CLOSURE SKIN 1/2X4 (GAUZE/BANDAGES/DRESSINGS) ×2 IMPLANT
SUT MNCRL 4-0 (SUTURE) ×1
SUT MNCRL 4-0 27XMFL (SUTURE) ×1
SUT VIC AB 3-0 SH 27 (SUTURE) ×1
SUT VIC AB 3-0 SH 27X BRD (SUTURE) ×1 IMPLANT
SUT VICRYL 0 AB UR-6 (SUTURE) ×4 IMPLANT
SUTURE MNCRL 4-0 27XMF (SUTURE) ×1 IMPLANT
TROCAR ADV FIXATION 12X100MM (TROCAR) ×2 IMPLANT
TROCAR Z-THREAD OPTICAL 5X100M (TROCAR) ×2 IMPLANT
WATER STERILE IRR 1000ML POUR (IV SOLUTION) ×2 IMPLANT

## 2019-11-05 NOTE — Discharge Instructions (Signed)
Laparoscopic Cholecystectomy, Care After °This sheet gives you information about how to care for yourself after your procedure. Your doctor may also give you more specific instructions. If you have problems or questions, contact your doctor. °Follow these instructions at home: °Care for cuts from surgery (incisions) ° °· Follow instructions from your doctor about how to take care of your cuts from surgery. Make sure you: °? Wash your hands with soap and water before you change your bandage (dressing). If you cannot use soap and water, use hand sanitizer. °? Change your bandage as told by your doctor. °? Leave stitches (sutures), skin glue, or skin tape (adhesive) strips in place. They may need to stay in place for 2 weeks or longer. If tape strips get loose and curl up, you may trim the loose edges. Do not remove tape strips completely unless your doctor says it is okay. °· Do not take baths, swim, or use a hot tub until your doctor says it is okay. Ask your doctor if you can take showers. You may only be allowed to take sponge baths for bathing. °· Check your surgical cut area every day for signs of infection. Check for: °? More redness, swelling, or pain. °? More fluid or blood. °? Warmth. °? Pus or a bad smell. °Activity °· Do not drive or use heavy machinery while taking prescription pain medicine. °· Do not lift anything that is heavier than 10 lb (4.5 kg) until your doctor says it is okay. °· Do not play contact sports until your doctor says it is okay. °· Do not drive for 24 hours if you were given a medicine to help you relax (sedative). °· Rest as needed. Do not return to work or school until your doctor says it is okay. °General instructions °· Take over-the-counter and prescription medicines only as told by your doctor. °· To prevent or treat constipation while you are taking prescription pain medicine, your doctor may recommend that you: °? Drink enough fluid to keep your pee (urine) clear or pale  yellow. °? Take over-the-counter or prescription medicines. °? Eat foods that are high in fiber, such as fresh fruits and vegetables, whole grains, and beans. °? Limit foods that are high in fat and processed sugars, such as fried and sweet foods. °Contact a doctor if: °· You develop a rash. °· You have more redness, swelling, or pain around your surgical cuts. °· You have more fluid or blood coming from your surgical cuts. °· Your surgical cuts feel warm to the touch. °· You have pus or a bad smell coming from your surgical cuts. °· You have a fever. °· One or more of your surgical cuts breaks open. °Get help right away if: °· You have trouble breathing. °· You have chest pain. °· You have pain that is getting worse in your shoulders. °· You faint or feel dizzy when you stand. °· You have very bad pain in your belly (abdomen). °· You are sick to your stomach (nauseous) for more than one day. °· You have throwing up (vomiting) that lasts for more than one day. °· You have leg pain. °This information is not intended to replace advice given to you by your health care provider. Make sure you discuss any questions you have with your health care provider. °Document Released: 08/29/2008 Document Revised: 11/02/2017 Document Reviewed: 05/08/2016 °Elsevier Patient Education © 2020 Elsevier Inc. ° ° °AMBULATORY SURGERY  °DISCHARGE INSTRUCTIONS ° ° °1) The drugs that you were given   will stay in your system until tomorrow so for the next 24 hours you should not:  A) Drive an automobile B) Make any legal decisions C) Drink any alcoholic beverage   2) You may resume regular meals tomorrow.  Today it is better to start with liquids and gradually work up to solid foods.  You may eat anything you prefer, but it is better to start with liquids, then soup and crackers, and gradually work up to solid foods.   3) Please notify your doctor immediately if you have any unusual bleeding, trouble breathing, redness and pain at  the surgery site, drainage, fever, or pain not relieved by medication.    4) Additional Instructions:        Please contact your physician with any problems or Same Day Surgery at [phone], Monday through Friday 6 am to 4 pm, or [place] at [facility] number at [phone].         CIRUGIA AMBULATORIA       Instruccionnes de alta    Date (Fecha)    1.  Las drogas que se Statistician en su cuerpo [employer], asi            que por las proximas 24 horas usted no debe:   Conducir Scientist, research (medical)) un automovil   Hacer ninguna decision legal   Tomar ninguna bebida alcoholica  2.  A) [place] puede comenzar una dieta regular.  Es mejor que hoy empiece con           liquidos y gradualmente anada comidas solidas.       B) Puede comer cualquier comida que desee pero es mejor empezar con liquidos,                      luego sopitas con galletas saladas y gradualmente llegar a las comidas solidas.  3.  Por favor avise a su medico inmediatamente si usted tiene algun sangrado anormal,       tiene dificultad con la respiracion, enrojecimiento y Social research officer, government en el sitio de la cirugia, [place],       fiebro o dolor que se alivia con [place].  4.  A) Su visita posoperatoria (despues de su operacion) es con el  Dr.   Date                 Time         B)  Por favor llame para hacer la cita posoperatoria.  5.  Istrucciones especificas :

## 2019-11-05 NOTE — Anesthesia Post-op Follow-up Note (Signed)
Anesthesia QCDR form completed.        

## 2019-11-05 NOTE — Telephone Encounter (Signed)
[facility] called stating they received a prescription of Norco for the patient and needed to know the diagnoses to document. I notified pharmacist that patient did have surgery today for "Symptomatic Cholelithiasis performed by Fredirick Maudlin, MD." Pharmacist verbalized understanding.

## 2019-11-05 NOTE — Anesthesia Preprocedure Evaluation (Signed)
Anesthesia Evaluation  Patient identified by MRN, date of birth, ID band Patient awake    Reviewed: Allergy & Precautions, NPO status , Patient's Chart, lab work & pertinent test results  History of Anesthesia Complications (+) PONV and history of anesthetic complications  Airway Mallampati: III       Dental   Pulmonary asthma ,    Pulmonary exam normal        Cardiovascular negative cardio ROS Normal cardiovascular exam     Neuro/Psych negative neurological ROS  negative psych ROS   GI/Hepatic Neg liver ROS, GERD  ,  Endo/Other  negative endocrine ROS  Renal/GU negative Renal ROS  negative genitourinary   Musculoskeletal negative musculoskeletal ROS (+)   Abdominal Normal abdominal exam  (+)   Peds negative pediatric ROS (+)  Hematology negative hematology ROS (+)   Anesthesia Other Findings   Reproductive/Obstetrics                             Anesthesia Physical Anesthesia Plan  ASA: II  Anesthesia Plan: General   Post-op Pain Management:    Induction: Intravenous  PONV Risk Score and Plan:   Airway Management Planned: Oral ETT  Additional Equipment:   Intra-op Plan:   Post-operative Plan: Extubation in OR  Informed Consent: I have reviewed the patients History and Physical, chart, labs and discussed the procedure including the risks, benefits and alternatives for the proposed anesthesia with the patient or authorized representative who has indicated his/her understanding and acceptance.     Dental advisory given  Plan Discussed with: CRNA and Surgeon  Anesthesia Plan Comments:         Anesthesia Quick Evaluation

## 2019-11-05 NOTE — Interval H&P Note (Signed)
History and Physical Interval Note:  11/05/2019 8:12 AM  [name] Moore  has presented today for surgery, with the diagnosis of biliary colic.  The various methods of treatment have been discussed with the patient and family. After consideration of risks, benefits and other options for treatment, the patient has consented to  Procedure(s): LAPAROSCOPIC CHOLECYSTECTOMY (N/A) as a surgical intervention.  The patient's history has been reviewed, patient examined, no change in status, stable for surgery.  I have reviewed the patient's chart and labs.  Questions were answered to the patient's satisfaction.     Fredirick Maudlin

## 2019-11-05 NOTE — Anesthesia Postprocedure Evaluation (Signed)
Anesthesia Post Note  Patient: [name] Moore  Procedure(s) Performed: LAPAROSCOPIC CHOLECYSTECTOMY (N/A )  Patient location during evaluation: PACU Anesthesia Type: General Level of consciousness: awake and alert and oriented Pain management: pain level controlled Vital Signs Assessment: post-procedure vital signs reviewed and stable Respiratory status: spontaneous breathing Cardiovascular status: blood pressure returned to baseline Anesthetic complications: no     Last Vitals:  Vitals:   11/05/19 1225 11/05/19 1252  BP: 108/63 106/61  Pulse: 76 84  Resp: 17 16  Temp: (!) 36.3 C   SpO2: 98% 99%    Last Pain:  Vitals:   11/05/19 1252  TempSrc:   PainSc: 2                  Willard Madrigal

## 2019-11-05 NOTE — Transfer of Care (Signed)
Immediate Anesthesia Transfer of Care Note  Patient: [name] Moore  Procedure(s) Performed: LAPAROSCOPIC CHOLECYSTECTOMY (N/A )  Patient Location: PACU  Anesthesia Type:General  Level of Consciousness: drowsy  Airway & Oxygen Therapy: Patient Spontanous Breathing and Patient connected to face mask oxygen  Post-op Assessment: Report given to RN and Post -op Vital signs reviewed and stable  Post vital signs: Reviewed and stable  Last Vitals:  Vitals Value Taken Time  BP 115/52 11/05/19 1026  Temp 36.9 C 11/05/19 1027  Pulse 89 11/05/19 1030  Resp 13 11/05/19 1030  SpO2 98 % 11/05/19 1030  Vitals shown include unvalidated device data.  Last Pain:  Vitals:   11/05/19 0715  TempSrc: Tympanic  PainSc: 0-No pain         Complications: No apparent anesthesia complications

## 2019-11-05 NOTE — Op Note (Signed)
Laparoscopic Cholecystectomy  Pre-operative Diagnosis: Symptomatic cholelithiasis  Post-operative Diagnosis: Same  Procedure: Laparoscopic cholecystectomy  Surgeon: Fredirick Maudlin, MD  Anesthesia: GETA  Assistant: None   Findings: The gallbladder was very thin-walled without inflammation to suggest acute cholecystitis.  There were multiple stones within the gallbladder.  The patient also had extensive filmy adhesions over the liver, suggestive of Fitz-Hugh Curtis syndrome  Estimated Blood Loss: Less than 10 cc         Drains: None         Specimens: Gallbladder           Complications: none   Procedure Details  The patient was seen again in the preoperative holding area. The benefits, complications, treatment options, and expected outcomes were discussed with the patient. The risks of bleeding, infection, recurrence of symptoms, failure to resolve symptoms, bile duct damage, bile duct leak, retained common bile duct stone, bowel injury, any of which could require further surgery and/or ERCP, stent, or papillotomy were reviewed with the patient. The likelihood of improving the patient's symptoms with return to their baseline status is good.  The patient and/or family concurred with the proposed plan, giving informed consent.  The patient was taken to operating room, identified as [name] Moore and the procedure verified as Laparoscopic Cholecystectomy. A time out was performed and the above information confirmed.  Prior to the induction of general anesthesia, antibiotic prophylaxis was administered. VTE prophylaxis was in place. General endotracheal anesthesia was then administered and tolerated well. After the induction, the abdomen was prepped with Chloraprep and draped in the sterile fashion. The patient was positioned in the supine position.  Optiview technique was used to enter the abdomen in the right upper quadrant via a 5 mm trocar.  Pneumoperitoneum was then created  with CO2 and tolerated well without any adverse changes in the patient's vital signs.  A 10 mm periumbilical port and 2 additional right upper quadrant 5-mm ports were placed, all under direct vision. All skin incisions  were infiltrated with a local anesthetic agent before making the incision and placing the trocars.   The patient was positioned  in reverse Trendelenburg, tilted slightly to the patient's left.  There were extensive filmy adhesions from the liver to the abdominal wall.  These were lysed with electrocautery in order to facilitate liver mobility.  The gallbladder was identified, the fundus grasped and retracted cephalad. Adhesions were lysed bluntly. The infundibulum was grasped and retracted laterally, exposing the peritoneum overlying the triangle of [place]ot. This was then divided and exposed in a blunt fashion. An extended critical view of the cystic duct and cystic artery was obtained.  The cystic duct was clearly identified and bluntly dissected free. Both the cystic artery and duct were double clipped and divided.  The gallbladder was taken from the gallbladder fossa in a retrograde fashion with the electrocautery.  It was very thin-walled and at one point the wall was violated, spilling multiple gallstones into the abdominal cavity.  These were all removed with a stone grasper.  The gallbladder was then removed and placed in an Endo pouch bag. The liver bed was irrigated and inspected. Hemostasis was achieved with the electrocautery. Copious saline irrigation was utilized and was repeatedly aspirated until clear.  Surgicel and Vistaseal were applied to the liver bed.  The gallbladder and Endo pouch sac were then removed through a port site.    Inspection of the right upper quadrant was performed. No bleeding, bile duct injury or leak, or bowel  injury was noted. Pneumoperitoneum was released.  The periumbilical port site was closed with interrumpted 0 Vicryl sutures. 4-0 subcuticular  Monocryl was used to close the skin. Dermabond was applied.  The patient was then extubated and brought to the recovery room in stable condition. Sponge, lap, and needle counts were correct at closure and at the conclusion of the case.               Duanne Guess, MD, FACS  3

## 2019-11-05 NOTE — Anesthesia Procedure Notes (Signed)
Procedure Name: Intubation Date/Time: 11/05/2019 8:38 AM Performed by: Eben Burow, CRNA Pre-anesthesia Checklist: Patient identified, Emergency Drugs available, Suction available and Patient being monitored Patient Re-evaluated:Patient Re-evaluated prior to induction Oxygen Delivery Method: Circle system utilized Preoxygenation: Pre-oxygenation with 100% oxygen Induction Type: IV induction Ventilation: Mask ventilation without difficulty Laryngoscope Size: Miller and 2 Grade View: Grade I Tube type: Oral Tube size: 7.5 mm Number of attempts: 1 Airway Equipment and Method: Stylet and LTA kit utilized Placement Confirmation: ETT inserted through vocal cords under direct vision,  positive ETCO2 and breath sounds checked- equal and bilateral Secured at: 22 cm Tube secured with: Tape Dental Injury: Teeth and Oropharynx as per pre-operative assessment

## 2019-11-06 ENCOUNTER — Encounter: Payer: Self-pay | Admitting: General Surgery

## 2019-11-06 LAB — SURGICAL PATHOLOGY

## 2019-11-20 ENCOUNTER — Other Ambulatory Visit: Payer: Self-pay

## 2019-11-20 ENCOUNTER — Telehealth (INDEPENDENT_AMBULATORY_CARE_PROVIDER_SITE_OTHER): Payer: Self-pay | Admitting: General Surgery

## 2019-11-20 DIAGNOSIS — Z9049 Acquired absence of other specified parts of digestive tract: Secondary | ICD-10-CM

## 2019-11-20 NOTE — Progress Notes (Signed)
Virtual Visit via Telephone Note  I connected with [name] Moore on 11/20/19 at 11:00 AM EST by telephone and verified that I am speaking with the correct person using two identifiers.   I discussed the limitations, risks, security and privacy concerns of performing an evaluation and management service by telephone and the availability of in person appointments. I also discussed with the patient that there may be a patient responsible charge related to this service. The patient expressed understanding and agreed to proceed.  Today's visit was held with the assistance of a Spanish language interpreter via the language line.  History of Present Illness: This is a 33 year old woman who underwent a laparoscopic cholecystectomy on November 05, 2019.   Observations/Objective: The patient reports that on one occasion after her surgery, she ate [place] and had some abdominal pain and vomiting, but this was just a one-time and she has not experienced it since.  She also had some constipation shortly after her operation, but this is also resolved.  She denies any fevers or chills.  She is tolerating a regular diet.  She states that the incisions appear to be healing nicely.  She denies any redness or drainage from any of the sites.  She reports a little bit of itching, but feels that this is normal for the healing process.  Assessment and Plan: This is a 33 year old woman who had a cholecystectomy for symptomatic cholelithiasis.  She is doing well postoperatively.  Follow Up Instructions: She may resume all of her usual activities without restrictions at this time.  We will see her on an as-needed basis.   I discussed the assessment and treatment plan with the patient. The patient was provided an opportunity to ask questions and all were answered. The patient agreed with the plan and demonstrated an understanding of the instructions.   The patient was advised to call back or seek an in-person  evaluation if the symptoms worsen or if the condition fails to improve as anticipated.  I provided 5 minutes of non-face-to-face time during this encounter.   Fredirick Maudlin, MD

## 2019-12-10 ENCOUNTER — Telehealth: Payer: Self-pay | Admitting: Pediatric Intensive Care

## 2019-12-10 NOTE — Telephone Encounter (Signed)
Call to client with [facility] Interpretation [phone]. ID [id number]. Client is feeling better since her gallbladder surgery. CN reviewed continued adherence to lower fat diet. Client has received bills from [facility] and the anesthesiology practice. CN advised client that she would contact the anesthesioloy practice regarding her bill. Client states that she received a CFA letter and was told before the surgery that the bills would be covered. CN will contact CFA office at [facility]. Client will send CFA letter copy to CN. Shann Medal RN BSN CNP [phone]

## 2019-12-12 ENCOUNTER — Telehealth: Payer: Self-pay

## 2019-12-12 NOTE — Telephone Encounter (Signed)
Opened in error

## 2019-12-12 NOTE — Telephone Encounter (Signed)
Message sent to Shann Medal, RN /CNP noting that as per Jenene Slicker, [facility] Financial Counselor, the patient has been approved for [employer] 100$ from 09/2019 - 03/2020 and she has also been approved for the [facility] for the same time period

## 2020-10-15 IMAGING — US US ABDOMEN COMPLETE
1 series · 14 of 25 positions shown · non-contrast
Comparison: None.

CLINICAL DATA: Epigastric pain for several days

EXAM:
ABDOMEN ULTRASOUND COMPLETE

[Series 1: us abdomen complete · 14 of 84 slices shown]
[im 1/84]
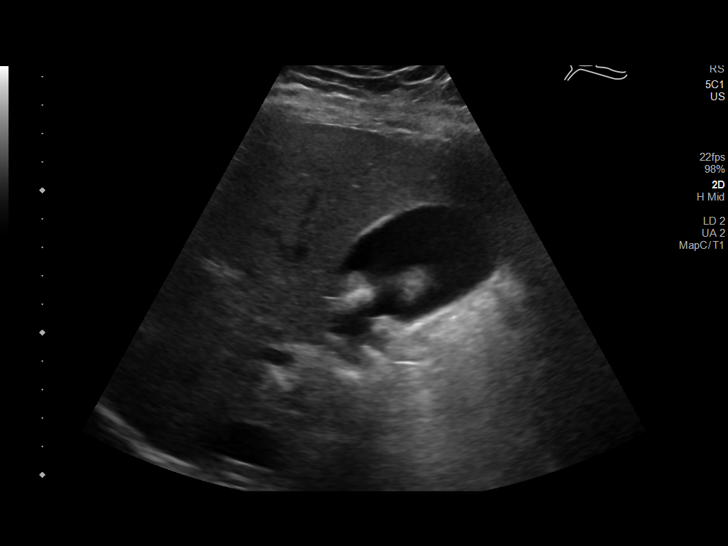
[im 7/84]
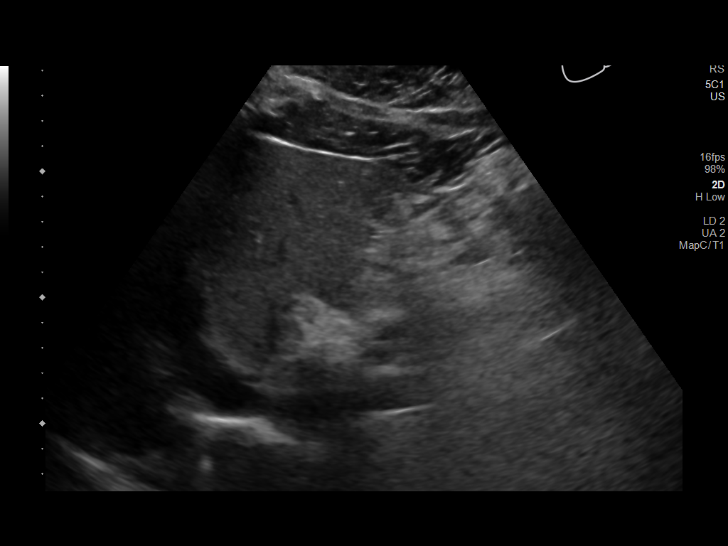
[im 14/84]
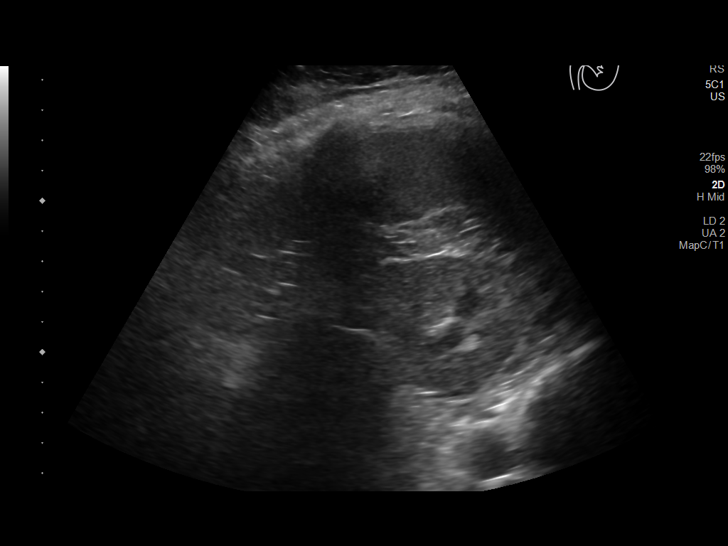
[im 21/84]
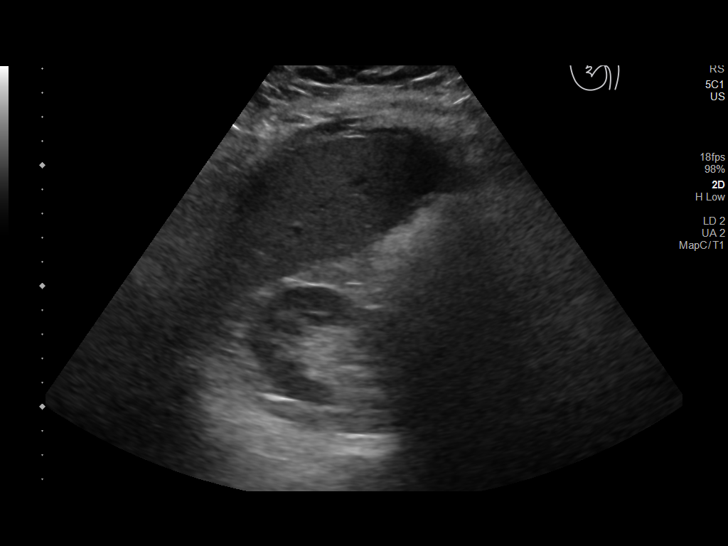
[im 28/84]
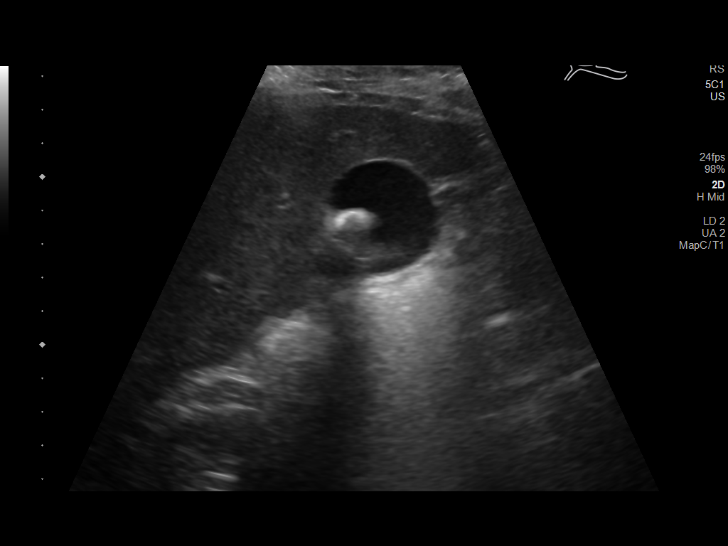
[im 32/84]
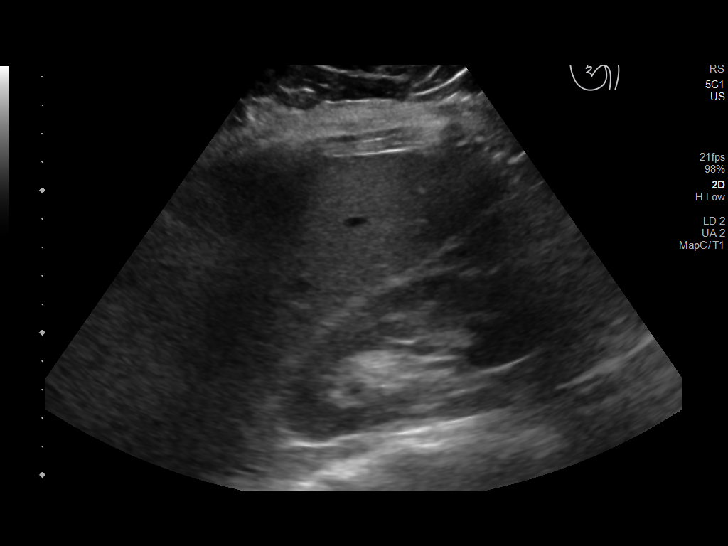
[im 39/84]
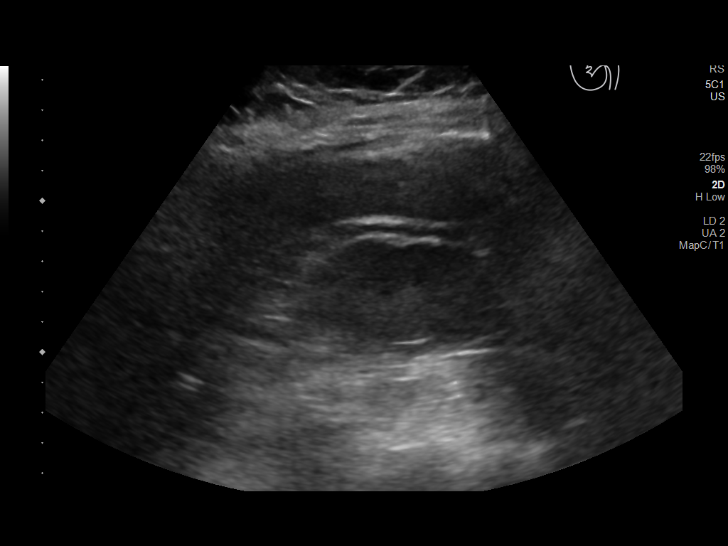
[im 45/84]
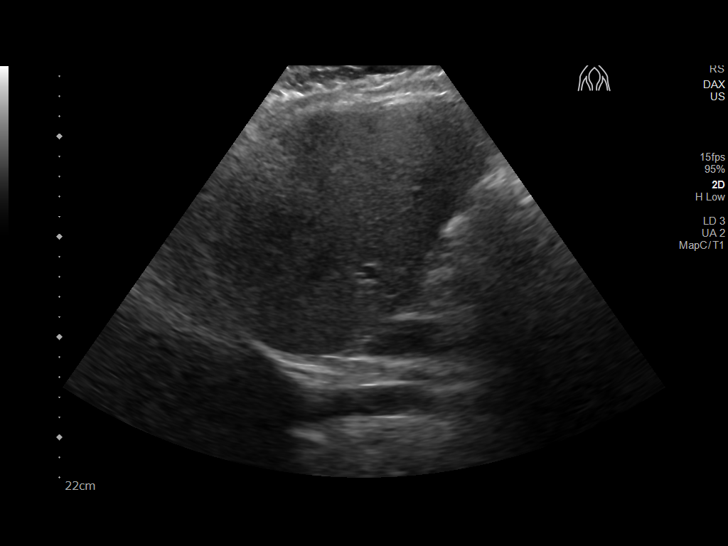
[im 52/84]
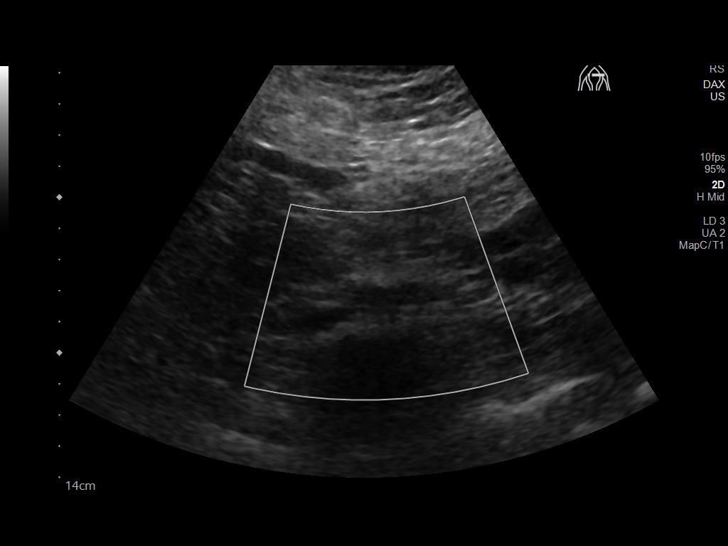
[im 56/84]
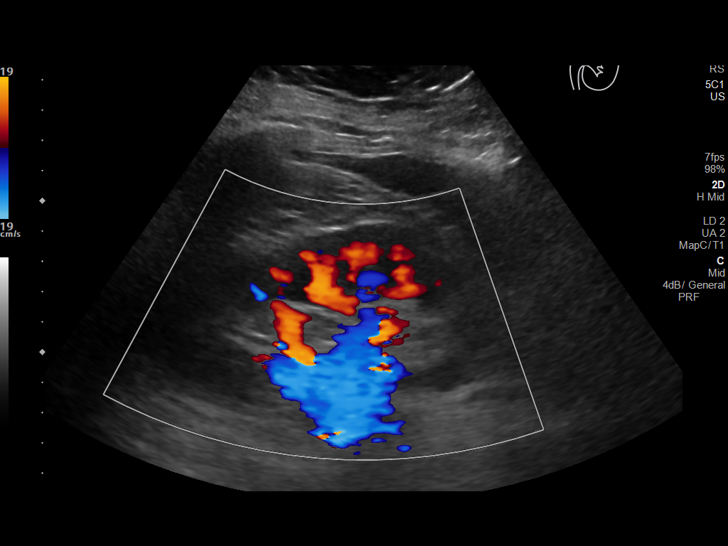
[im 63/84]
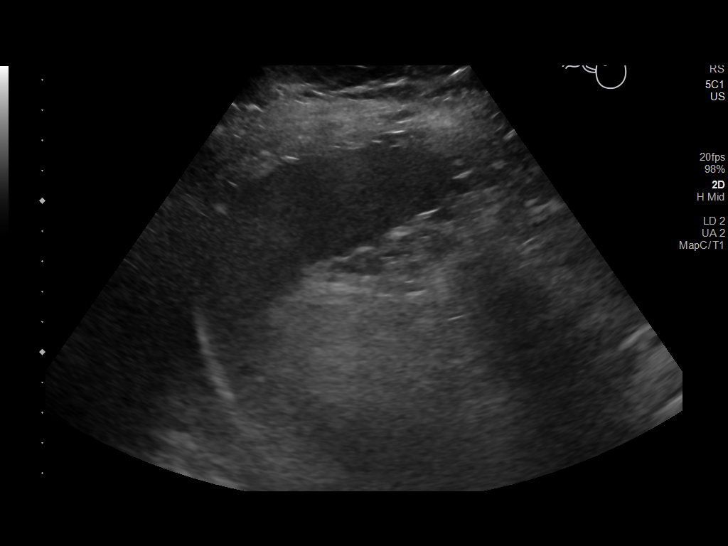
[im 70/84]
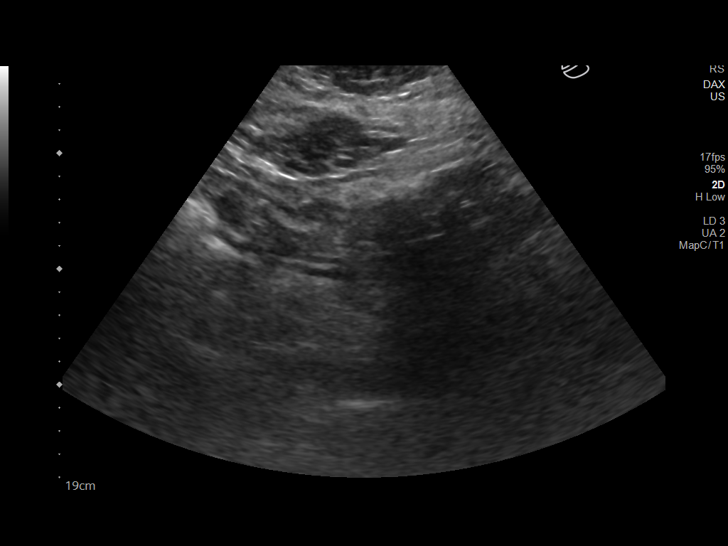
[im 77/84]
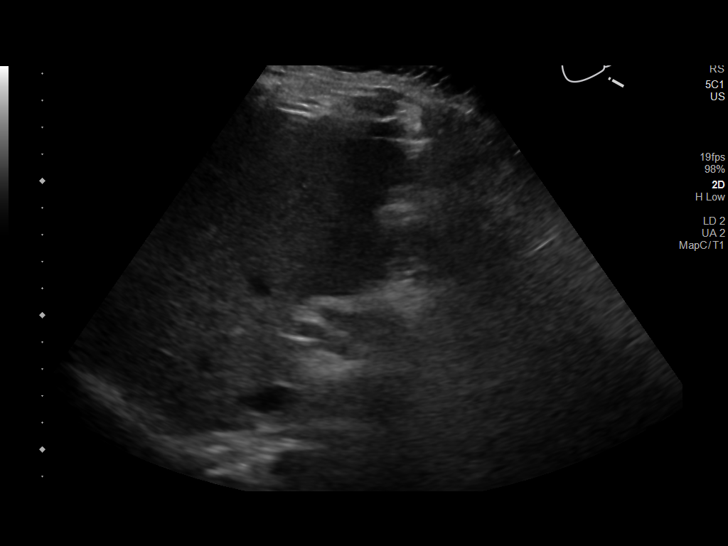
[im 84/84]
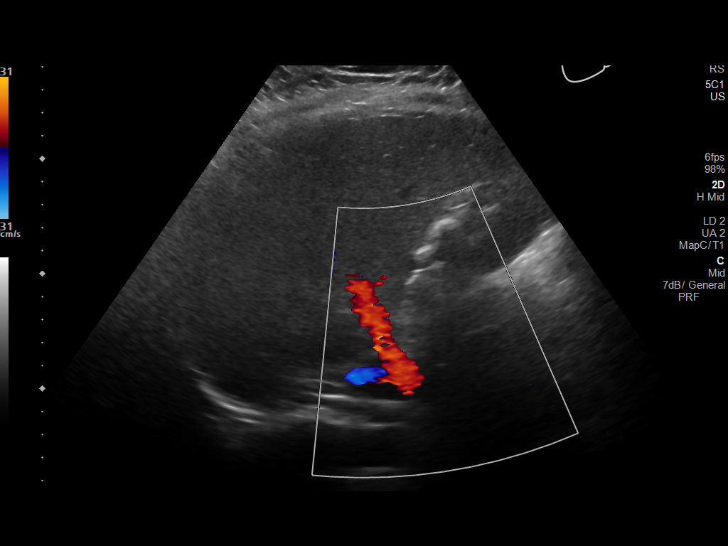

[14 of 25 positions shown; findings below may reference images not displayed]

FINDINGS: Gallbladder: Well distended with multiple gallstones without
complicating factors. No wall thickening or pericholecystic fluid is
noted. Negative sonographic Murphy's sign is seen.

Common bile duct: Diameter: 6.7 mm. No intrahepatic ductal
dilatation is seen.

Liver: Mild increased echogenicity is noted which may be related to
fatty infiltration. Portal vein is patent on color Doppler imaging
with normal direction of blood flow towards the liver.

IVC: Not well visualized due to overlying bowel gas.

Pancreas: Not well visualized due to overlying bowel gas.

Spleen: Size and appearance within normal limits.

Right Kidney: Length: 10.1 cm. Echogenicity within normal limits.
No mass or hydronephrosis visualized.

Left Kidney: Length: 11.4 cm. Echogenicity within normal limits. No
mass or hydronephrosis visualized.

Abdominal aorta: No aneurysm visualized.

Other findings: None.
IMPRESSION: Cholelithiasis without complicating factors. Mild prominence of the
common bile duct is noted.

## 2020-10-15 IMAGING — DX DG CHEST 2V
2 series · 2 of 2 positions shown · non-contrast
Comparison: None.

CLINICAL DATA: Chest pain and shortness of breath.

EXAM:
CHEST - 2 VIEW

[chest pa]
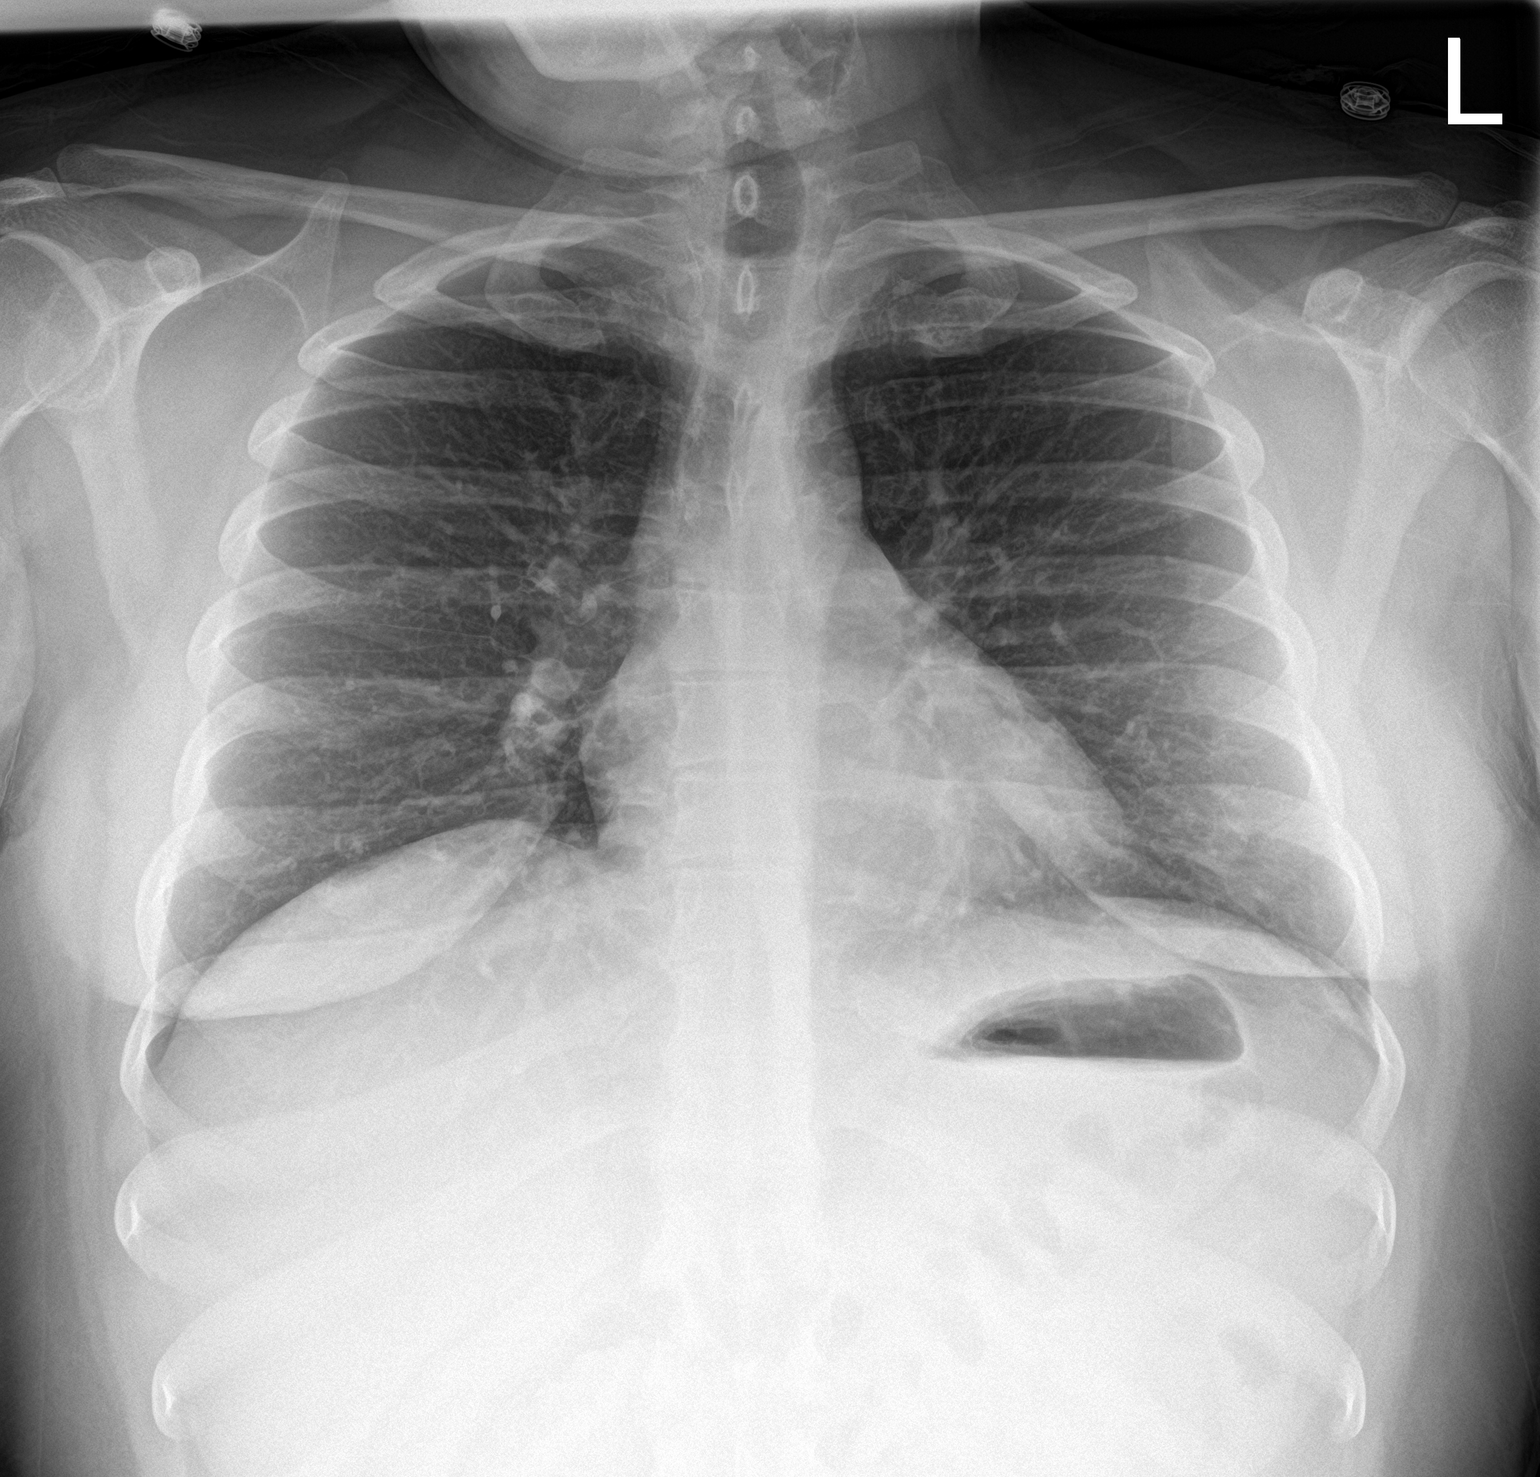

[chest lat]
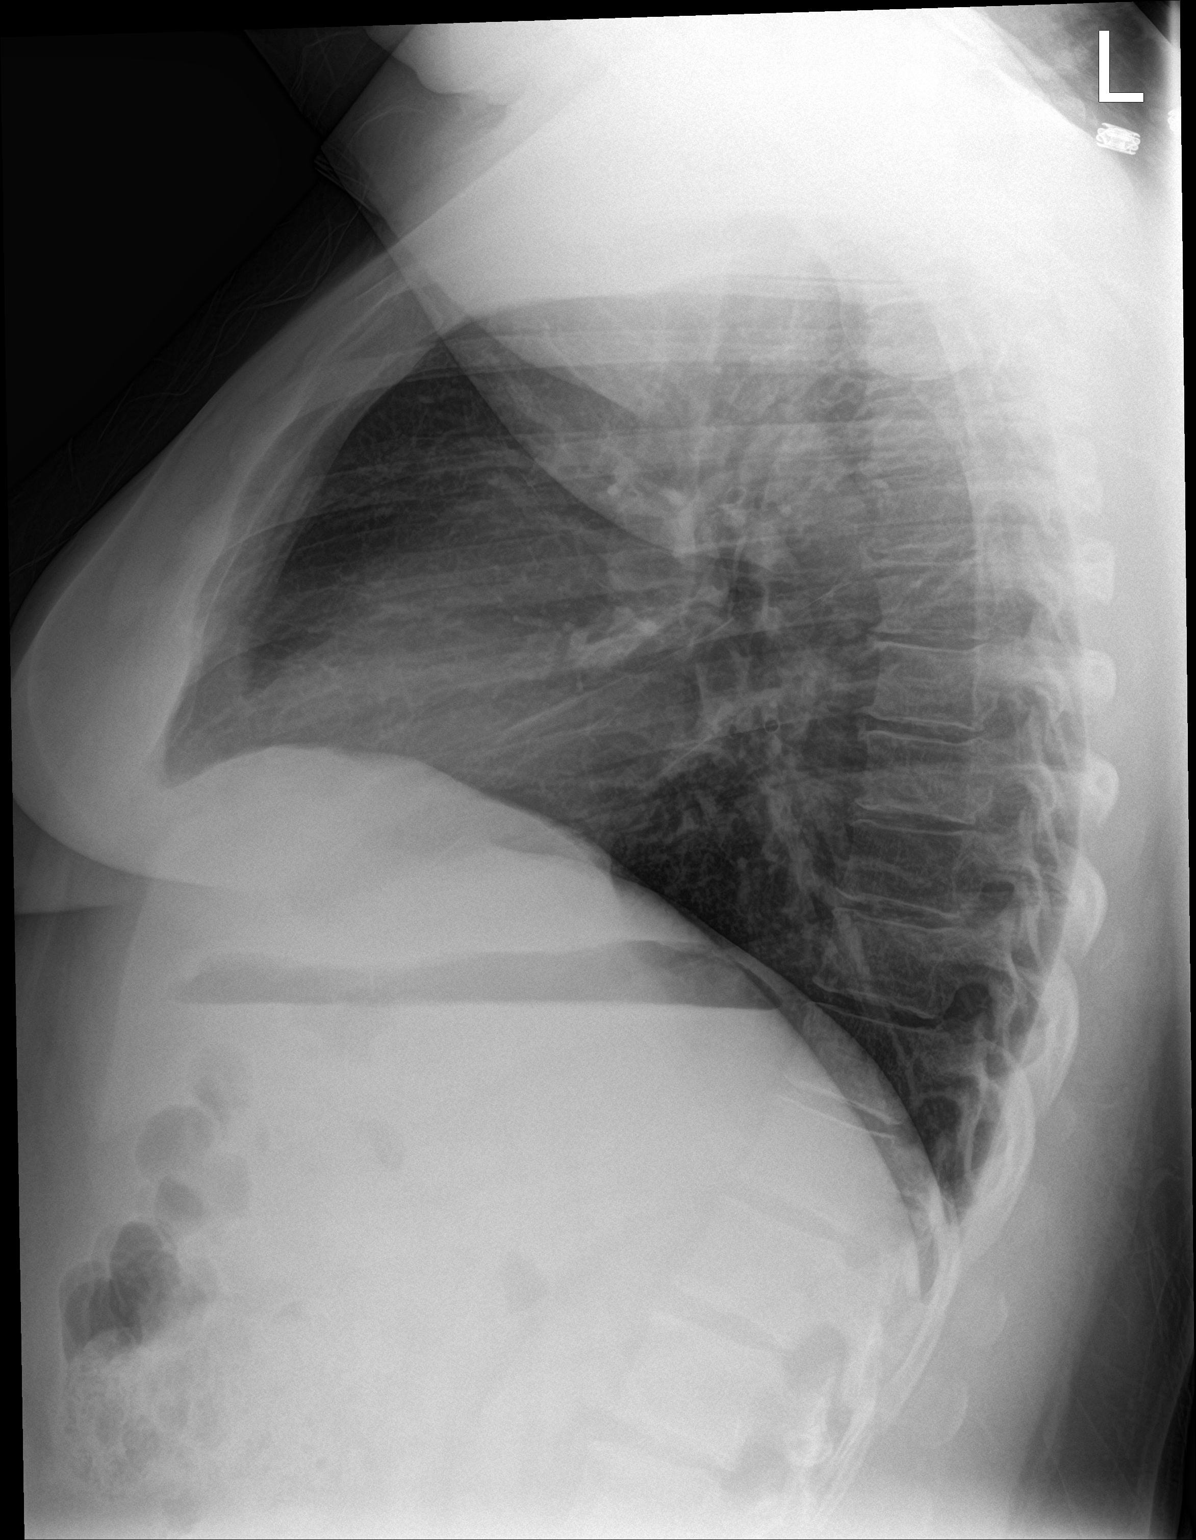

[2 of 2 positions shown; findings below may reference images not displayed]

FINDINGS: The lungs are clear without focal pneumonia, edema, pneumothorax or
pleural effusion. The cardiopericardial silhouette is within normal
limits for size. The visualized bony structures of the thorax are
intact.
IMPRESSION: No active cardiopulmonary disease.

## 2020-10-15 IMAGING — CT CT ANGIO CHEST
2 of 6 series · 19 of 36 positions shown · IV contrast (omnipaque)
Comparison: Chest x-ray and ultrasound from earlier in the same
day.

CLINICAL DATA: Epigastric pain with nausea and vomiting

EXAM:
CT ANGIOGRAPHY CHEST WITH CONTRAST
TECHNIQUE: Multidetector CT imaging of the chest was performed using the
standard protocol during bolus administration of intravenous
contrast. Multiplanar CT image reconstructions and MIPs were
obtained to evaluate the vascular anatomy.
CONTRAST:  66 mL OMNIPAQUE 350

[Series 8: pe thins · axial · 0.97mm/px · z∈[+1130,+1321]mm · 18 of 306 slices shown]
[im 16/306  lung]
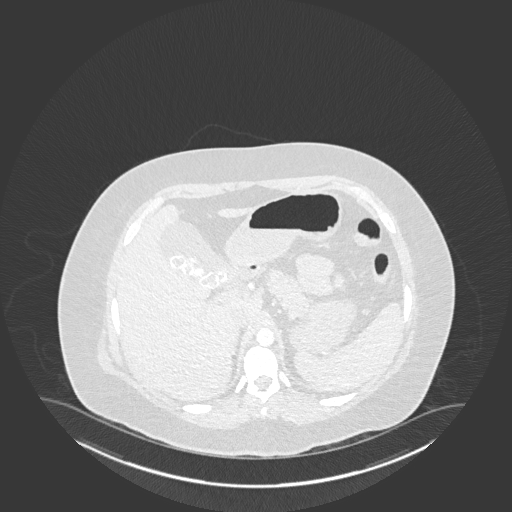
[im 31/306  mediastinal]
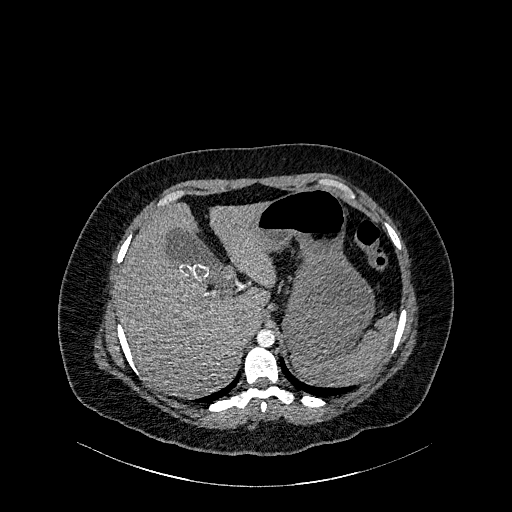
[im 46/306  lung]
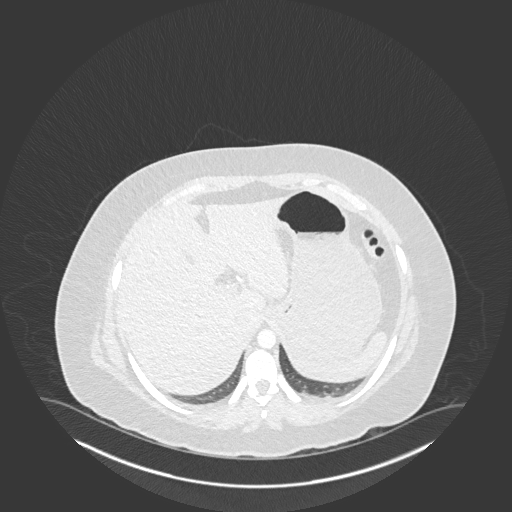
[im 62/306  mediastinal]
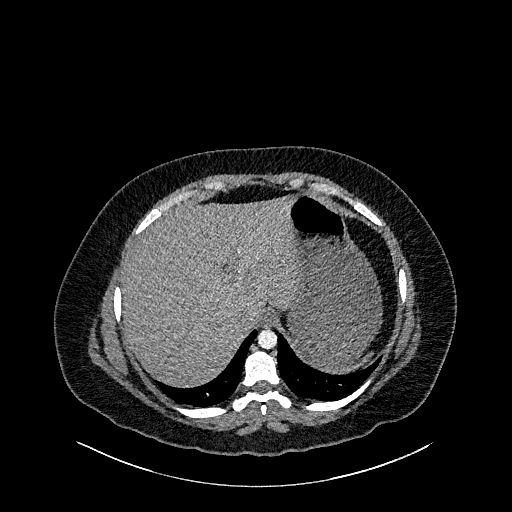
[im 77/306  lung]
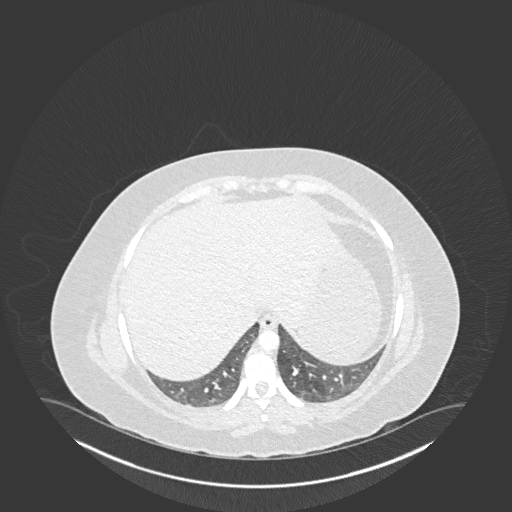
[im 92/306  mediastinal]
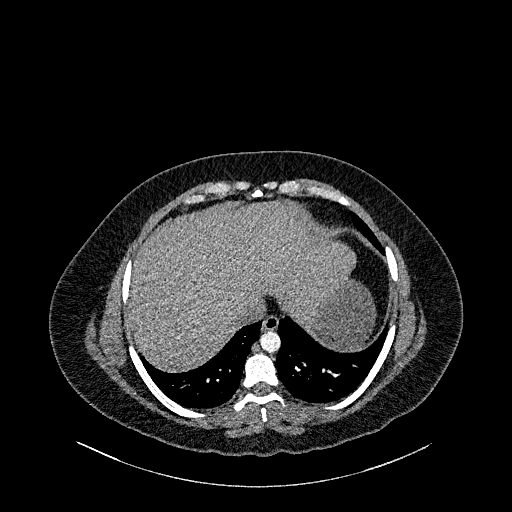
[im 107/306  lung]
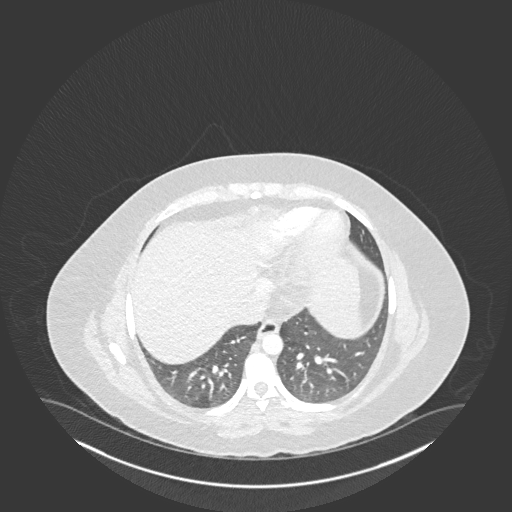
[im 123/306  mediastinal]
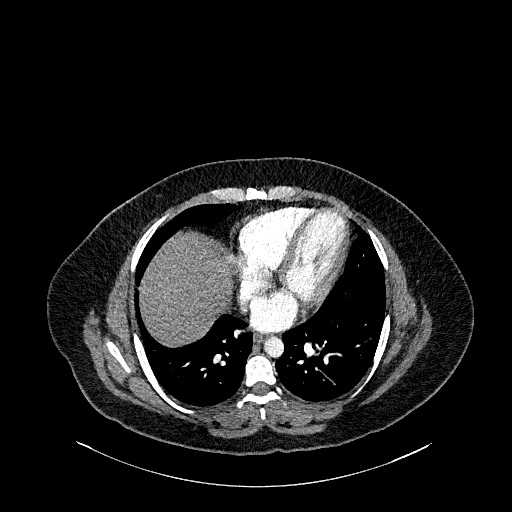
[im 138/306  lung]
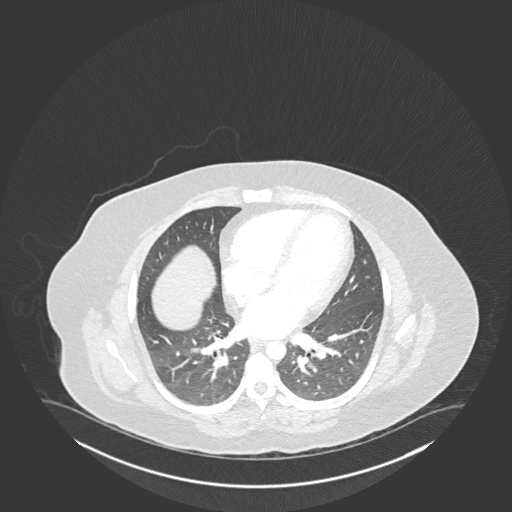
[im 168/306  mediastinal]
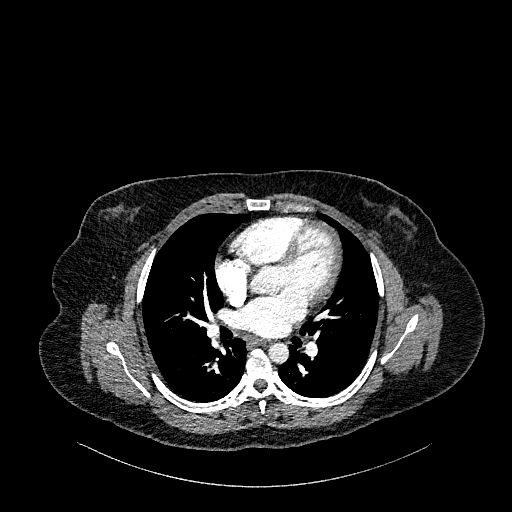
[im 184/306  lung]
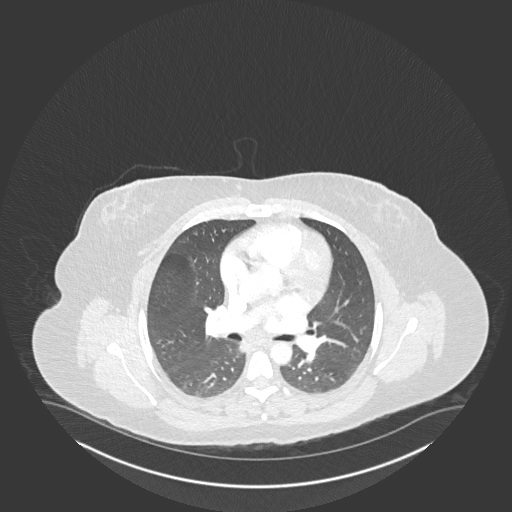
[im 199/306  mediastinal]
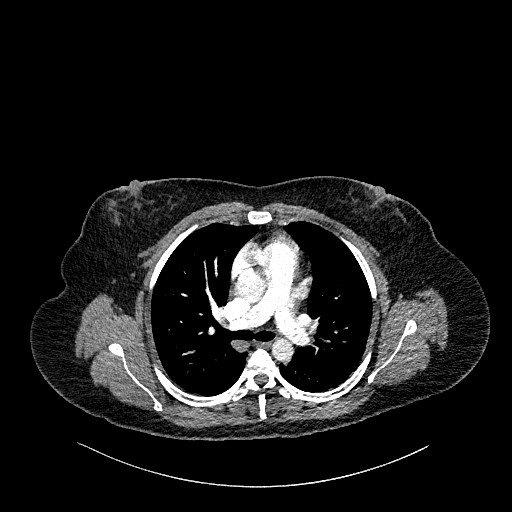
[im 214/306  lung]
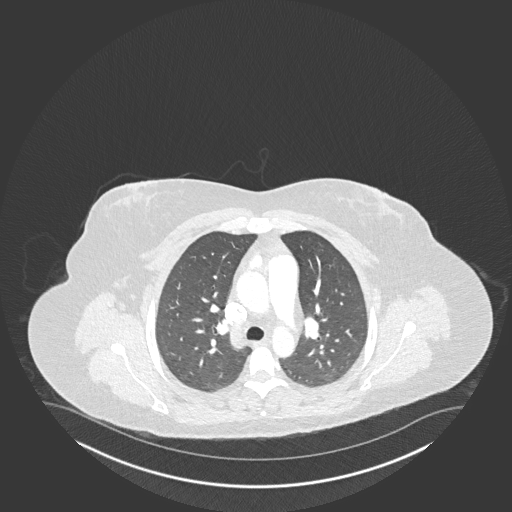
[im 229/306  mediastinal]
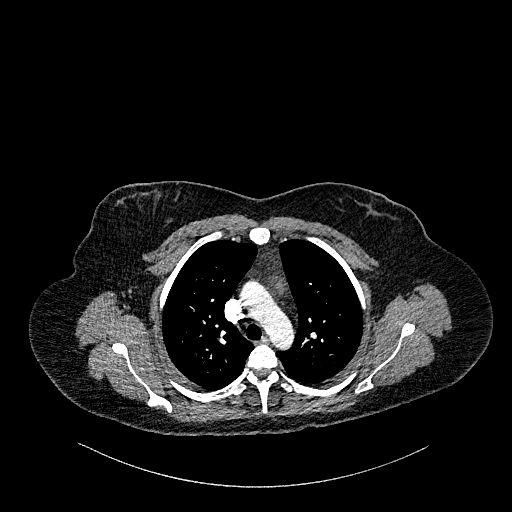
[im 245/306  lung]
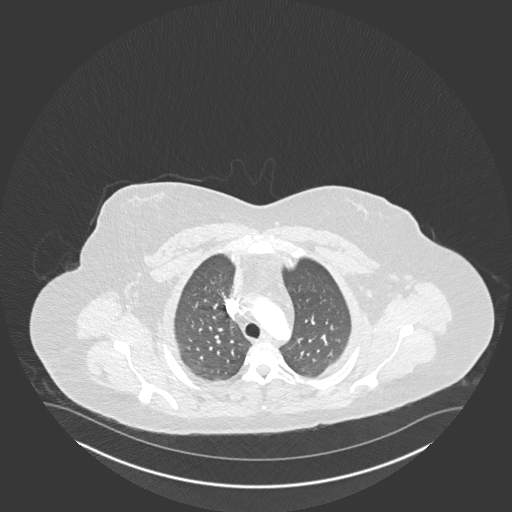
[im 260/306  mediastinal]
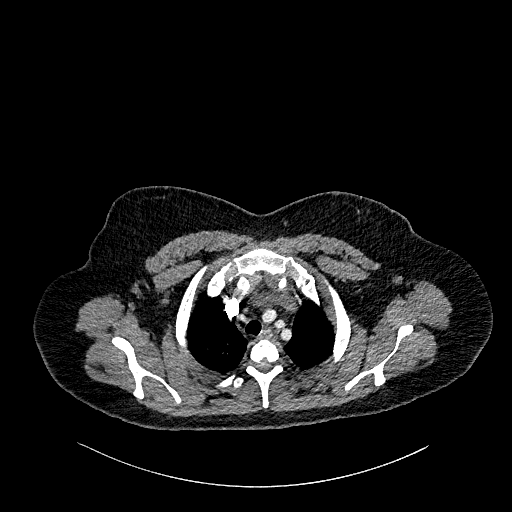
[im 275/306  lung]
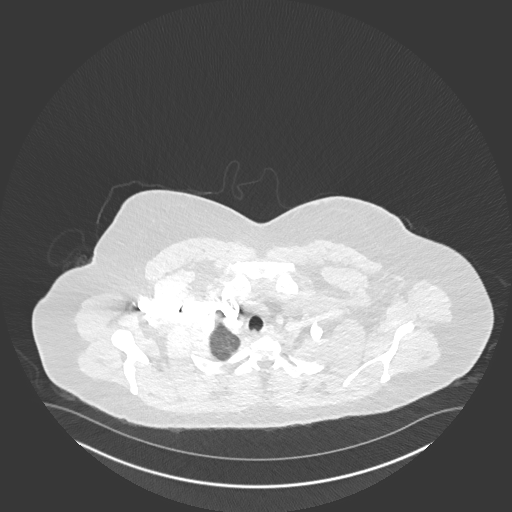
[im 290/306  mediastinal]
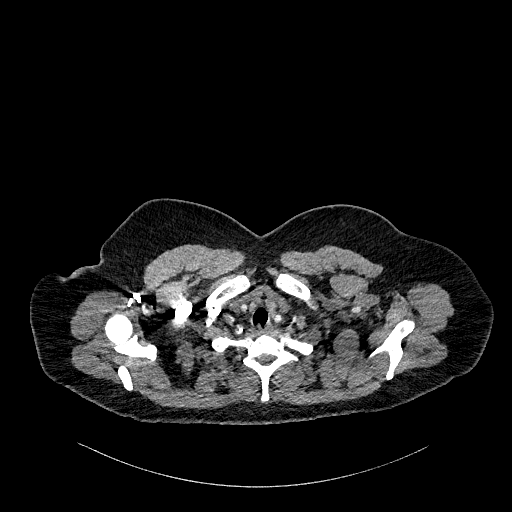

[Series 9: pe 2mm cor · coronal · 0.42mm/px · 1 of 151 slices shown]
[im 76/151  mediastinal]
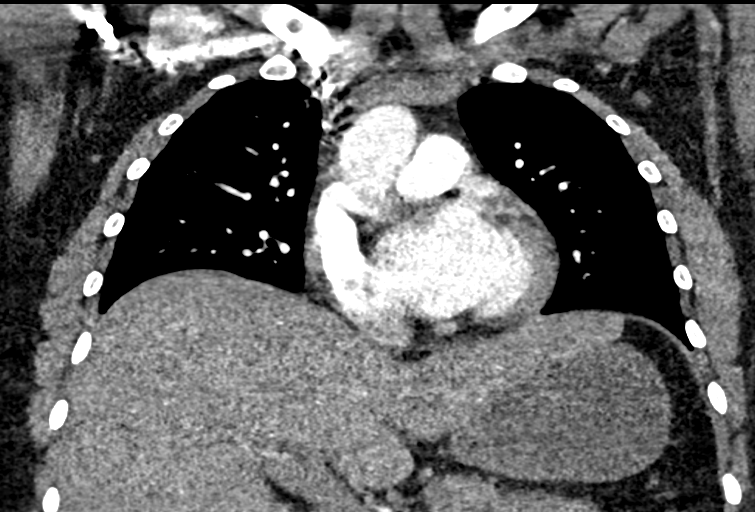

[19 of 36 positions shown; findings below may reference images not displayed]

FINDINGS: Cardiovascular: Thoracic aorta and its branches are within normal
limits. No aneurysmal dilatation or dissection is seen. No cardiac
enlargement is seen. The pulmonary artery is well visualized within
normal branching pattern. No definitive filling defect to suggest
pulmonary embolism is noted.

Mediastinum/Nodes: Thoracic inlet is within normal limits. No hilar
or mediastinal adenopathy is seen. Esophagus as visualized is within
normal limits.

Lungs/Pleura: Lungs are well aerated bilaterally without focal
infiltrate or sizable effusion.

Upper Abdomen: Visualized upper abdomen demonstrates cholelithiasis
similar to that seen on recent ultrasound.

Musculoskeletal: No chest wall abnormality. No acute or significant
osseous findings.

Review of the MIP images confirms the above findings.
IMPRESSION: No evidence of pulmonary emboli.

Cholelithiasis without complicating factors.

## 2021-08-24 ENCOUNTER — Encounter: Payer: Self-pay | Admitting: General Surgery
